# Patient Record
Sex: Female | Born: 1935 | Race: Black or African American | Hispanic: No | Smoking: Never smoker
Health system: Southern US, Community
[De-identification: ages and names within clinical notes are randomized; demographics above are authoritative.]

## PROBLEM LIST (undated history)

## (undated) DIAGNOSIS — I10 Essential (primary) hypertension: Secondary | ICD-10-CM

## (undated) DIAGNOSIS — Z90710 Acquired absence of both cervix and uterus: Secondary | ICD-10-CM

## (undated) DIAGNOSIS — C801 Malignant (primary) neoplasm, unspecified: Secondary | ICD-10-CM

## (undated) DIAGNOSIS — K219 Gastro-esophageal reflux disease without esophagitis: Secondary | ICD-10-CM

## (undated) DIAGNOSIS — I639 Cerebral infarction, unspecified: Secondary | ICD-10-CM

## (undated) DIAGNOSIS — M1990 Unspecified osteoarthritis, unspecified site: Secondary | ICD-10-CM

## (undated) DIAGNOSIS — E785 Hyperlipidemia, unspecified: Secondary | ICD-10-CM

## (undated) DIAGNOSIS — I2510 Atherosclerotic heart disease of native coronary artery without angina pectoris: Secondary | ICD-10-CM

## (undated) HISTORY — DX: Atherosclerotic heart disease of native coronary artery without angina pectoris: I25.10

## (undated) HISTORY — PX: CHOLECYSTECTOMY: SHX55

## (undated) HISTORY — PX: OTHER SURGICAL HISTORY: SHX169

## (undated) HISTORY — PX: ABDOMINAL HYSTERECTOMY: SHX81

## (undated) HISTORY — PX: TONSILLECTOMY: SUR1361

## (undated) HISTORY — PX: APPENDECTOMY: SHX54

## (undated) HISTORY — DX: Acquired absence of both cervix and uterus: Z90.710

## (undated) HISTORY — DX: Hyperlipidemia, unspecified: E78.5

---

## 1998-04-04 ENCOUNTER — Ambulatory Visit (HOSPITAL_COMMUNITY): Admission: RE | Admit: 1998-04-04 | Discharge: 1998-04-04 | Payer: Self-pay | Admitting: Specialist

## 1998-05-04 ENCOUNTER — Ambulatory Visit (HOSPITAL_COMMUNITY): Admission: RE | Admit: 1998-05-04 | Discharge: 1998-05-04 | Payer: Self-pay | Admitting: Specialist

## 2000-03-18 ENCOUNTER — Encounter: Admission: RE | Admit: 2000-03-18 | Discharge: 2000-06-16 | Payer: Self-pay | Admitting: Internal Medicine

## 2000-07-08 ENCOUNTER — Other Ambulatory Visit: Admission: RE | Admit: 2000-07-08 | Discharge: 2000-07-08 | Payer: Self-pay | Admitting: Internal Medicine

## 2010-10-17 ENCOUNTER — Inpatient Hospital Stay (HOSPITAL_COMMUNITY)
Admission: EM | Admit: 2010-10-17 | Discharge: 2010-10-21 | DRG: 313 | Disposition: A | Discharge status: Home / Self Care | Payer: Medicare Other | Source: Other Acute Inpatient Hospital | Attending: Internal Medicine | Admitting: Internal Medicine

## 2010-10-17 ENCOUNTER — Other Ambulatory Visit: Payer: Self-pay

## 2010-10-17 ENCOUNTER — Emergency Department (HOSPITAL_BASED_OUTPATIENT_CLINIC_OR_DEPARTMENT_OTHER)
Admission: EM | Admit: 2010-10-17 | Discharge: 2010-10-17 | Disposition: A | Discharge status: Other Acute Inpatient Hospital | Payer: Medicare Other | Source: Home / Self Care | Attending: Emergency Medicine | Admitting: Emergency Medicine

## 2010-10-17 ENCOUNTER — Emergency Department (INDEPENDENT_AMBULATORY_CARE_PROVIDER_SITE_OTHER): Payer: Medicare Other

## 2010-10-17 DIAGNOSIS — Z8673 Personal history of transient ischemic attack (TIA), and cerebral infarction without residual deficits: Secondary | ICD-10-CM

## 2010-10-17 DIAGNOSIS — Z8739 Personal history of other diseases of the musculoskeletal system and connective tissue: Secondary | ICD-10-CM | POA: Insufficient documentation

## 2010-10-17 DIAGNOSIS — Z9861 Coronary angioplasty status: Secondary | ICD-10-CM

## 2010-10-17 DIAGNOSIS — K219 Gastro-esophageal reflux disease without esophagitis: Secondary | ICD-10-CM | POA: Diagnosis present

## 2010-10-17 DIAGNOSIS — M1990 Unspecified osteoarthritis, unspecified site: Secondary | ICD-10-CM | POA: Diagnosis present

## 2010-10-17 DIAGNOSIS — R0602 Shortness of breath: Secondary | ICD-10-CM | POA: Insufficient documentation

## 2010-10-17 DIAGNOSIS — E785 Hyperlipidemia, unspecified: Secondary | ICD-10-CM | POA: Diagnosis present

## 2010-10-17 DIAGNOSIS — I10 Essential (primary) hypertension: Secondary | ICD-10-CM

## 2010-10-17 DIAGNOSIS — E876 Hypokalemia: Secondary | ICD-10-CM | POA: Diagnosis present

## 2010-10-17 DIAGNOSIS — Z8679 Personal history of other diseases of the circulatory system: Secondary | ICD-10-CM | POA: Insufficient documentation

## 2010-10-17 DIAGNOSIS — R079 Chest pain, unspecified: Secondary | ICD-10-CM

## 2010-10-17 DIAGNOSIS — Z8711 Personal history of peptic ulcer disease: Secondary | ICD-10-CM

## 2010-10-17 DIAGNOSIS — M7989 Other specified soft tissue disorders: Secondary | ICD-10-CM

## 2010-10-17 DIAGNOSIS — I2510 Atherosclerotic heart disease of native coronary artery without angina pectoris: Secondary | ICD-10-CM | POA: Diagnosis present

## 2010-10-17 DIAGNOSIS — Z01812 Encounter for preprocedural laboratory examination: Secondary | ICD-10-CM

## 2010-10-17 DIAGNOSIS — R0789 Other chest pain: Principal | ICD-10-CM | POA: Diagnosis present

## 2010-10-17 DIAGNOSIS — Z7902 Long term (current) use of antithrombotics/antiplatelets: Secondary | ICD-10-CM

## 2010-10-17 HISTORY — DX: Gastro-esophageal reflux disease without esophagitis: K21.9

## 2010-10-17 HISTORY — DX: Cerebral infarction, unspecified: I63.9

## 2010-10-17 HISTORY — DX: Malignant (primary) neoplasm, unspecified: C80.1

## 2010-10-17 HISTORY — DX: Essential (primary) hypertension: I10

## 2010-10-17 HISTORY — DX: Unspecified osteoarthritis, unspecified site: M19.90

## 2010-10-17 LAB — CBC
HCT: 40.6 % (ref 36.0–46.0)
MCH: 30.9 pg (ref 26.0–34.0)
MCV: 90.8 fL (ref 78.0–100.0)
RDW: 12.4 % (ref 11.5–15.5)
WBC: 7.7 10*3/uL (ref 4.0–10.5)

## 2010-10-17 LAB — COMPREHENSIVE METABOLIC PANEL
AST: 31 U/L (ref 0–37)
Albumin: 4.1 g/dL (ref 3.5–5.2)
Alkaline Phosphatase: 86 U/L (ref 39–117)
BUN: 19 mg/dL (ref 6–23)
Potassium: 4 mEq/L (ref 3.5–5.1)
Sodium: 141 mEq/L (ref 135–145)
Total Protein: 8.1 g/dL (ref 6.0–8.3)

## 2010-10-17 LAB — CARDIAC PANEL(CRET KIN+CKTOT+MB+TROPI)
CK, MB: 4.4 ng/mL — ABNORMAL HIGH (ref 0.3–4.0)
Relative Index: 1.8 (ref 0.0–2.5)
Troponin I: <0.3 ng/mL (ref <0.30)

## 2010-10-17 MED ORDER — SODIUM CHLORIDE 0.9 % IV SOLN: 20.0000 mL | INTRAVENOUS | Status: DC

## 2010-10-17 MED ORDER — NITROGLYCERIN 0.4 MG SL SUBL
0.4000 mg | SUBLINGUAL_TABLET | SUBLINGUAL | Status: DC | PRN
  Administered 2010-10-17: 0.4 mg via SUBLINGUAL
  Filled 2010-10-17: qty 25

## 2010-10-17 NOTE — ED Notes (Signed)
Patient on bedpan without difficulty and minimal assistance

## 2010-10-17 NOTE — ED Provider Notes (Signed)
History     Chief Complaint  Patient presents with  . Chest Pain   Patient is a 77 y.o. female presenting with chest pain. The history is provided by the patient.  Chest Pain The chest pain began 3 - 5 hours ago. Chest pain occurs intermittently. The pain is associated with breathing. At its most intense, the pain is at 4/10. The pain is currently at 1/10. The severity of the pain is moderate. The quality of the pain is described as aching. The pain radiates to the epigastrium and left shoulder. Chest pain is worsened by deep breathing. Primary symptoms include shortness of breath.  Pertinent negatives for associated symptoms include no near-syncope and no weakness. She tried nothing for the symptoms. Risk factors include being elderly.  Her past medical history is significant for CAD, hyperlipidemia and hypertension.  Her family medical history is significant for heart disease in family.  Procedure history is positive for cardiac catheterization.     Past Medical History  Diagnosis Date  . Hypertension   . GERD (gastroesophageal reflux disease)   . Stroke   . Arthritis   . Cancer     Past Surgical History  Procedure Date  . Multiple surgeries r/t cancer in legs   . Abdominal hysterectomy     No family history on file.  History  Substance Use Topics  . Smoking status: Never Smoker   . Smokeless tobacco: Not on file  . Alcohol Use: No    OB History    Grav Para Term Preterm Abortions TAB SAB Ect Mult Living                  Review of Systems  Respiratory: Positive for shortness of breath.   Cardiovascular: Positive for chest pain. Negative for near-syncope.  Neurological: Negative for weakness.  All other systems reviewed and are negative.    Physical Exam  BP 195/87  Pulse 61  Temp(Src) 98.2 F (36.8 C) (Oral)  Resp 20  Ht 5\' 4"  (1.626 m)  Wt 165 lb (74.844 kg)  BMI 28.32 kg/m2  SpO2 99%  Physical Exam  Constitutional: She appears well-developed and  well-nourished.  HENT:  Head: Normocephalic and atraumatic.  Eyes: Conjunctivae and EOM are normal. Pupils are equal, round, and reactive to light.  Neck: Normal range of motion. Neck supple.  Cardiovascular: Normal rate and regular rhythm.   Pulmonary/Chest: Effort normal and breath sounds normal.  Abdominal: Soft. Bowel sounds are normal.  Musculoskeletal: Normal range of motion.  Neurological: She has normal reflexes.  Skin: Skin is warm and dry.  Psychiatric: She has a normal mood and affect.    ED Course  Procedures  MDM   Pt reports no further chest pain after nitro, Pt reports she still feels bad but she is not having chest pain.   Date: 10/17/2010  Rate: 65  Rhythm: normal sinus rhythm  QRS Axis: normal  Intervals: normal  ST/T Wave abnormalities: normal  Conduction Disutrbances:none  Narrative Interpretation:   Old EKG Reviewed: none available

## 2010-10-17 NOTE — ED Notes (Signed)
Report given to [facility] and transport to [facility] , pt denies CP

## 2010-10-17 NOTE — ED Notes (Signed)
Report called to [facility]

## 2010-10-18 ENCOUNTER — Inpatient Hospital Stay (HOSPITAL_COMMUNITY): Payer: Medicare Other

## 2010-10-18 DIAGNOSIS — R072 Precordial pain: Secondary | ICD-10-CM

## 2010-10-18 LAB — CARDIAC PANEL(CRET KIN+CKTOT+MB+TROPI)
CK, MB: 3.3 ng/mL (ref 0.3–4.0)
Relative Index: 1.9 (ref 0.0–2.5)
Relative Index: 1.8 (ref 0.0–2.5)
Total CK: 183 U/L — ABNORMAL HIGH (ref 7–177)

## 2010-10-18 LAB — CBC
HCT: 38.1 % (ref 36.0–46.0)
MCHC: 33.6 g/dL (ref 30.0–36.0)
MCV: 91.1 fL (ref 78.0–100.0)
RDW: 12.5 % (ref 11.5–15.5)

## 2010-10-18 LAB — LIPID PANEL
HDL: 41 mg/dL (ref >39)
LDL Cholesterol: 97 mg/dL (ref 0–99)
Triglycerides: 139 mg/dL (ref <150)
VLDL: 28 mg/dL (ref 0–40)

## 2010-10-18 LAB — DIFFERENTIAL
Eosinophils Relative: 3 % (ref 0–5)
Lymphocytes Relative: 47 % — ABNORMAL HIGH (ref 12–46)
Lymphs Abs: 3.3 10*3/uL (ref 0.7–4.0)
Monocytes Absolute: 0.5 10*3/uL (ref 0.1–1.0)

## 2010-10-18 LAB — BASIC METABOLIC PANEL
BUN: 16 mg/dL (ref 6–23)
CO2: 28 mEq/L (ref 19–32)
Chloride: 104 mEq/L (ref 96–112)
Glucose, Bld: 97 mg/dL (ref 70–99)
Potassium: 3.6 mEq/L (ref 3.5–5.1)

## 2010-10-19 ENCOUNTER — Encounter (HOSPITAL_COMMUNITY): Payer: Medicare Other

## 2010-10-19 LAB — BASIC METABOLIC PANEL
BUN: 20 mg/dL (ref 6–23)
Chloride: 101 mEq/L (ref 96–112)
GFR calc Af Amer: >60 mL/min (ref >60)
Potassium: 3.3 mEq/L — ABNORMAL LOW (ref 3.5–5.1)
Sodium: 139 mEq/L (ref 135–145)

## 2010-10-19 LAB — CBC
HCT: 38.5 % (ref 36.0–46.0)
Platelets: 205 10*3/uL (ref 150–400)
RDW: 12.5 % (ref 11.5–15.5)
WBC: 7 10*3/uL (ref 4.0–10.5)

## 2010-10-19 MED ORDER — TECHNETIUM TC 99M TETROFOSMIN IV KIT
10.0000 | PACK | Freq: Once | INTRAVENOUS | Status: AC | PRN
  Administered 2010-10-19: 10 via INTRAVENOUS

## 2010-10-19 MED ORDER — TECHNETIUM TC 99M TETROFOSMIN IV KIT: 10.0000 | PACK | Freq: Once | INTRAVENOUS | Status: AC | PRN

## 2010-10-19 MED ORDER — TECHNETIUM TC 99M TETROFOSMIN IV KIT
30.0000 | PACK | Freq: Once | INTRAVENOUS | Status: AC | PRN
  Administered 2010-10-19: 30 via INTRAVENOUS

## 2010-10-20 ENCOUNTER — Other Ambulatory Visit (HOSPITAL_COMMUNITY): Payer: Medicare Other

## 2010-10-20 LAB — BASIC METABOLIC PANEL
CO2: 28 mEq/L (ref 19–32)
Chloride: 102 mEq/L (ref 96–112)
GFR calc Af Amer: 58 mL/min — ABNORMAL LOW (ref >60)
Potassium: 3.7 mEq/L (ref 3.5–5.1)
Sodium: 139 mEq/L (ref 135–145)

## 2010-10-20 NOTE — ED Provider Notes (Signed)
History/physical exam/procedure(s) were performed by non-physician practitioner and as supervising physician I was immediately available for consultation/collaboration. I have reviewed all notes and am in agreement with care and plan.   Hilario Quarry, MD 10/20/10 [phone]

## 2010-10-21 ENCOUNTER — Inpatient Hospital Stay (HOSPITAL_COMMUNITY): Payer: Medicare Other

## 2010-10-21 LAB — CBC
HCT: 38.3 % (ref 36.0–46.0)
Hemoglobin: 12.9 g/dL (ref 12.0–15.0)
MCV: 90.3 fL (ref 78.0–100.0)
RBC: 4.24 MIL/uL (ref 3.87–5.11)
WBC: 6.9 10*3/uL (ref 4.0–10.5)

## 2010-10-21 MED ORDER — XENON XE 133 GAS
10.0000 | GAS_FOR_INHALATION | Freq: Once | RESPIRATORY_TRACT | Status: AC | PRN
  Administered 2010-10-21: 10 via RESPIRATORY_TRACT

## 2010-10-21 MED ORDER — TECHNETIUM TO 99M ALBUMIN AGGREGATED
5.0000 | Freq: Once | INTRAVENOUS | Status: AC | PRN
  Administered 2010-10-21: 5 via INTRAVENOUS

## 2010-10-22 NOTE — Discharge Summary (Signed)
[name], Wendy Lucas              ACCOUNT NO.:  [id number]  MEDICAL RECORD NO.:  [id number]  LOCATION:  2036                         FACILITY:  [facility]  PHYSICIAN:  Isidor Holts, M.D.  DATE OF BIRTH:  1923-04-23  DATE OF ADMISSION:  10/17/2010 DATE OF DISCHARGE:  10/21/2010                              DISCHARGE SUMMARY   PRIMARY MD:  Dr. Hessie Dibble at [facility].  DISCHARGE DIAGNOSES: 1. Chest pain, likely noncardiac. Status post negative stress Myoview     on October 19, 2010. 2. History of coronary artery disease. 3. Hypertension. 4. Status post previous cerebrovascular accident. 5. Dyslipidemia. 6. Gastroesophageal reflux disease. 7. Osteoarthritis.  DISCHARGE MEDICATIONS: 1. Amlodipine 10 mg p.o. daily. 2. Plavix 75 mg p.o. daily with meals. 3. Atenolol 50 mg p.o. daily. 4. Benicar HCT (40/25) 1 tablet p.o. daily. 5. Clobetasol 0.05% ointment one application topically t.i.d. 6. Livalo 4 mg p.o. daily. 7. Protonix 40 mg p.o. daily. 8. Tylenol Extra Strength 1000 mg p.o. at bedtime. 9. Multivitamin therapeutic 1 p.o. daily. 10.Potassium chloride 10 mEq p.o. b.i.d.  PROCEDURES: 1. Chest x-ray on October 17, 2010.  This showed no evidence for acute     cardiopulmonary abnormality. 2. Stress Myoview, October 19, 2010.  This showed no evidence of     ischemia or infarction in any vascular territory, ejection fraction     was 77% and wall motion was normal. 3. Ventilation-perfusion lung scan, October 21, 2010.  This was very low     probability for acute pulmonary embolism.  There was mild     obstructive lung disease. 4. Chest x-ray, October 21, 2010.  This was stable examination with no     active cardiopulmonary process.  CONSULTATIONS:  Madolyn Frieze. Jens Som, MD, [facility], cardiologist.  ADMISSION HISTORY:  As in H&P notes of October 17, 2010, dictated by Dr. Della Goo.  However, in brief, this is an 77 year old female, with known history of hypertension, dyslipidemia, GERD,  previous CVA, osteoarthritis, history of coronary artery disease status post PCI approximately 11 years ago at [facility], presenting with two episodes of retrosternal chest pain.  She was admitted for further evaluation, investigation, and management.  CLINICAL COURSE: 1. Chest pain.  The patient presented as described above.  She was     placed on telemetric monitoring.  Cardiac enzymes were cycled and     remained unelevated.  Chest x-ray was unremarkable for acute     pathology.  Twelve-lead EKG showed no acute ischemic changes.     Cardiology consultation was kindly provided by Dr. Olga Millers,     who arranged a stress Myoview, which was carried out on October 19, 2010 and showed no evidence of ischemia or infarction in any     vascular territory.  The patient's ejection fraction was preserved     and estimated at 77%.  Wall motion was normal.  The patient has     been reassured accordingly.  Given her previous history of     cerebrovascular disease as well as a known coronary artery disease,     it was felt that she is definitely a candidate for antiplatelet  medications.  As she is intolerant of ASPIRIN, she has been     commenced on Plavix accordingly.  She continues on preadmission     beta-blocker.  2. Hypertension.  The patient's BP remained mildly elevated in the     initial couple of days of hospitalization, in spite of preadmission     antihypertensive medications.  Norvasc has therefore been added to     antihypertensive medications and as of October 20, 2010, she was     normotensive and remained so thereafter.  3. History of previous CVA.  The patient complains of occasional right-     sided tingling paresthesia without overt weakness, which appears to     be episodic.  This may represent TIAs.  She has been commenced on     antiplatelet medications as described above.  4. Dyslipidemia.  The patient's lipid profile was as follows:  Total     cholesterol 166,  triglyceride 139, HDL 41, LDL 97.  She continues     on her preadmission statin treatment.  5. GERD.  The patient was asymptomatic from this viewpoint.  She is on     proton pump inhibitor.  DISPOSITION:  The patient was on October 21, 2010 asymptomatic.  There were no new issues.  She was very keen to be discharged.  Because of an elevated D-dimer of 0.59, she underwent V/Q scan on October 21, 2010, which was reported as very low probability for pulmonary embolism.  The patient has been reassured accordingly.  She was discharged on October 21, 2010.  ACTIVITY:  As tolerated.  Recommended to increase activity slowly.  DIET:  Heart healthy.  FOLLOWUP INSTRUCTIONS:  The patient is to follow up with her primary MD, Dr. Hessie Dibble at [facility], per prior scheduled appointment, otherwise in 1-2 weeks.  She is instructed to call for an appointment.  In addition, she is to follow up with Dr. Olga Millers, [place] cardiologist at his [facility] office in 4-6 weeks.  The patient has been supplied with appropriate information and she and her family have verbalized understanding.     Isidor Holts, M.D.     CO/MEDQ  D:  10/21/2010  T:  10/21/2010  Job:  [id number]  cc:   Madolyn Frieze. Jens Som, MD, [facility] Dr. Hessie Dibble  Electronically Signed by Isidor Holts M.D. on 10/22/2010 07:17:25 PM

## 2010-10-23 NOTE — Consult Note (Signed)
Wendy Lucas, Wendy Lucas NO.:  [id number]  MEDICAL RECORD NO.:  [id number]  LOCATION:  2036                         FACILITY:  [facility]  PHYSICIAN:  Madolyn Frieze. Jens Som, MD, FACCDATE OF BIRTH:  02/10/1924  DATE OF CONSULTATION:  10/18/2010 DATE OF DISCHARGE:                                CONSULTATION   The patient is an 77 year old female with past medical history of coronary artery disease, hypertension, hyperlipidemia, prior CVA, peptic ulcer disease, and gastroesophageal reflux disease whom I am asked to evaluate chest pain.  The patient apparently had PCI approximately 11 years ago in [facility].  I do not have those records available.  She has had intermittent chest pain since that time.  However, yesterday, she had a more severe episode while shopping.  The pain is substernal and described as a pressing sensation.  It radiates to her neck and down her left upper extremity.  There is associated shortness of breath,diaphoresis, and nausea.  The pain is not pleuritic, positional, or exertional.  It resolved spontaneously in the past, but yesterday she went to [facility] and it improved with nitroglycerin. She has not had any pain since.  Note she denies any exertional chest pain, but does have dyspnea on exertion.  There is no orthopnea, but she occasionally has mild pedal edema.  Because of her chest pain, Cardiology is asked to further evaluate.  MEDICATIONS:  Her medications include: 1. Atenolol 50 mg daily. 2. Enoxaparin 40 mg subcu daily. 3. Hydrochlorothiazide 25 mg p.o. daily. 4. Nitroglycerin paste. 5. Benicar 40 mg p.o. daily. 6. Protonix 40 mg p.o. b.i.d. 7. P.r.n. medications.  ALLERGIES:  She has an allergy to DYE.  She also has an allergy to SULFA, CODEINE, ELAVIL, and ULTRAM.  FAMILY HISTORY:  Her family history is strongly positive for coronary artery disease.  PAST MEDICAL HISTORY:  Her past medical history is significant  for hypertension, hyperlipidemia.  There is no diabetes mellitus.  She does have a history of coronary artery disease as outlined in the HPI.  She has a history of CVAs.  She also has osteoarthritis and gastroesophageal reflux disease.  PAST SURGICAL HISTORY:  She has had prior surgery on her lower extremities for cancer of unknown type.  She has also had previous hysterectomy and tonsillectomy as well as appendectomy.  SOCIAL HISTORY:  She does not smoke nor does she consume alcohol.  REVIEW OF SYSTEMS:  She denies any headaches, fevers, or chills.  There is no productive cough or hemoptysis.  There is no dysphagia, odynophagia, melena, or hematochezia.  There is no dysuria or hematuria. There is no rash or seizure activity.  There is no orthopnea, PND, or pedal edema.  Remaining systems are negative.  PHYSICAL EXAMINATION:  VITAL SIGNS:  Blood pressure of 152/72 and pulse is 67.  Temperature is 97.4. GENERAL:  She is a well developed and well nourished, in no acute distress.  The patient does not appear to be depressed.  There is no peripheral clubbing. SKIN:  Warm and dry. BACK:  Normal. HEENT:  Normal with normal eyelids. NECK:  Supple with normal upstroke bilaterally.  No bruits noted.  There  is no jugular venous distention.  I cannot appreciate thyromegaly. CHEST:  Clear to auscultation and normal expansion. CARDIOVASCULAR:  Regular rate and rhythm.  Normal S1 and S2.  There was a 2/6 systolic ejection murmur at the sternal border. ABDOMEN:  Nontender and nondistended.  Positive bowel sounds.  No hepatosplenomegaly.  No mass appreciated.  There is no abdominal bruit. She has 2+ femoral pulses bilaterally.  No bruits. EXTREMITIES:  No edema and I can palpate no cords.  She has 2+ dorsalis pedis pulses bilaterally. NEUROLOGIC:  Grossly intact.  LABORATORY DATA:  Negative cardiac markers.  A pro-BNP is 336.  Her sodium is 141 with a potassium of 3.6.  BUN and creatinine 16  and 0.94. White blood cell count is 7.1 with a hemoglobin of 12.8 and hematocrit of 38.1.  Platelets 193.  Liver functions are normal.  Chest x-ray shows no acute cardiopulmonary abnormality.  Her electrocardiogram shows a sinus rhythm at a rate of 65.  There are no ST changes noted.  DIAGNOSES: 1. Chest pain - the patient's symptoms are concerning.  She has     multiple risk factors and prior PCI.  We discussed the options     today including cardiac catheterization versus functional study.  I     did recommend cardiac catheterization, but she is somewhat     hesitant.  She is concerned about her DYE allergy and also invasive     procedures at her age.  We therefore agreed that she will continue     with a functional study and if abnormal we would proceed with     cardiac catheterization at that point.  We will continue with her     present cardiac medications.  She states that she has had an     intolerance to ASPIRIN in the past secondary to GI upset.  I will     try enteric-coated aspirin 81 mg p.o. daily to see if she     tolerates.  We will also add a statin. 2. Hypertension - we will follow her blood pressure and increase her     medications as needed. 3. Hyperlipidemia - we will add a statin. 4. History of peptic ulcer disease.     Madolyn Frieze Jens Som, MD, [facility]     BSC/MEDQ  D:  10/18/2010  T:  10/18/2010  Job:  [id number]  Electronically Signed by Olga Millers MD [facility] on 10/23/2010 03:54:30 PM

## 2010-10-28 NOTE — H&P (Signed)
Wendy Lucas, Wendy Lucas NO.:  [id number]  MEDICAL RECORD NO.:  [id number]  LOCATION:  2036                         FACILITY:  [facility]  PHYSICIAN:  Della Goo, M.D. DATE OF BIRTH:  1924-02-01  DATE OF ADMISSION:  10/17/2010 DATE OF DISCHARGE:                             HISTORY & PHYSICAL   PRIMARY CARE PHYSICIAN:  Unassigned, Dr. Hessie Dibble with [facility].  CHIEF COMPLAINT:  Chest pain and shortness of breath.  HISTORY OF PRESENT ILLNESS:  This is an 77 year old female who presented to the [facility] Emergency Department secondary to complaints of chest pain which started at about noon on October 17, 2010. She reports having 2 episodes, the first episode lasted about a minute. She describes the pain is being intense in the substernal area.  She reports feeling some nausea and shortness of breath with that.  She rated the pain as being 4-6/10.  She reports that she had another episode of pain which returned and this time the pain began to radiate into the left shoulder.  She also had shortness of breath and nausea and dizziness and felt as if she would faint.  The patient does have a history of coronary artery disease and hyperlipidemia as well as hypertension and the family history of heart disease.  She reports over 11 years ago having a balloon angioplasty of one-vessel.  She denies having any stent placement in that vessel.  She reports that her cardiologist that performed the procedure is no longer in practice.  PAST MEDICAL HISTORY:  Significant for hypertension, hyperlipidemia, gastroesophageal reflux disease, previous CVA and osteoarthritis.  The patient has a history of cancer, reports that the cancer was in her legs.  MEDICATIONS: 1. Atenolol 50 mg 1 p.o. daily. 2. Benicar/HCT 40/25 one p.o. daily. 3. Protonix 40 mg 1 p.o. daily. 4. Acetaminophen 500 mg p.o. p.r.n. 5. Multivitamin 1 p.o. daily. 6. Pitavastatin calcium 4 mg do not know  when the interval is on this     medication. 7. Clobestasol ointment p.r.n.  ALLERGIES:  The patient has reported allergies to TRAMADOL, SULFA, SHELLFISH, ASPIRIN and CODEINE.  PAST SURGICAL HISTORY:  History of a total abdominal hysterectomy and appendectomy.  SOCIAL HISTORY:  The patient lives in a senior apartment independently. She is a nonsmoker and nondrinker.  No history of illicit drug usage.  FAMILY HISTORY:  Positive for coronary artery disease.  REVIEW OF SYSTEMS:  Pertinents mentioned above.  PHYSICAL EXAMINATION FINDINGS:  GENERAL:  This is an elderly well- nourished, well-developed 77 year old African American female, who is in no acute distress. VITAL SIGNS:  Temperature 98.2, blood pressure currently 124/78, heart rate 67, respirations 20 and O2 saturations 100%. HEENT:  Normocephalic, atraumatic.  Pupils equally round, reactive to light.  Extraocular movements are intact.  Funduscopic benign.  There is no scleral icterus. NECK:  Supple.  Full range of motion.  No thyromegaly, adenopathy or jugular venous distention. CARDIOVASCULAR:  Regular rate and rhythm.  No murmurs, gallops or rubs appreciated.  Normal S1 and S2.  Chest wall nontender.  Chest wall excursion is symmetric.  Breathing is unlabored. LUNGS:  Clear to auscultation bilaterally.  No rales, rhonchi or wheezes. ABDOMEN:  Positive bowel sounds, soft, nontender and nondistended.  No hepatosplenomegaly. EXTREMITIES:  Without cyanosis, clubbing or edema. NEUROLOGIC:  The patient is alert and oriented x3.  She is able to move all 4 of her extremities.  There are no focal deficits on examination.  LABORATORY STUDIES:  Performed at the [facility] Emergency Department, white blood cell count 7.7, hemoglobin 13.8, hematocrit 40.6 and platelets 223.  Sodium 141, potassium 4.0, chloride 103, CO2 27, BUN 19, creatinine 1.10 and glucose 84.  AST 31, ALT 20, alkaline phosphatase 86, total bilirubin  0.4.  Cardiac enzymes with a total CK of 249, CK-MB 4.4, relative index 1.8, troponin less than 0.30.  EKG reveals a normal sinus rhythm.  No acute ST-segment changes are seen and chest x-ray reveals no acute disease process.  ASSESSMENT:  An 77 year old female being admitted with: 1. Chest pain, rule out acute coronary syndrome. 2. Hypertension. 3. Coronary artery disease history. 4. Hyperlipidemia. 5. Osteoarthritis. 6. Shortness of breath.  PLAN:  The patient will be admitted to 23-hour observation to a telemetry area.  Cardiac enzymes will be performed and the patient will be placed on nitro paste therapy and oxygen therapy at this time. Aspirin therapy has not been ordered secondary to a reported allergy by this patient.  Her regular medications have been reconciled, but there are a few medications that will need to be further verified.  Code status is a full code at this time.  DVT prophylaxis has been ordered. Further cardiac workup will ensue pending results of the patient's clinical course.     Della Goo, M.D.     HJ/MEDQ  D:  10/18/2010  T:  10/18/2010  Job:  [id number]  Electronically Signed by Della Goo M.D. on 10/28/2010 09:24:56 PM

## 2010-11-02 ENCOUNTER — Ambulatory Visit (HOSPITAL_COMMUNITY): Admission: RE | Admit: 2010-11-02 | Payer: Medicare Other | Source: Ambulatory Visit

## 2010-11-29 ENCOUNTER — Encounter: Payer: Medicare Other | Admitting: Cardiology

## 2010-12-12 ENCOUNTER — Encounter: Payer: Self-pay | Admitting: Cardiology

## 2010-12-13 ENCOUNTER — Ambulatory Visit (INDEPENDENT_AMBULATORY_CARE_PROVIDER_SITE_OTHER): Payer: Medicare Other | Admitting: Cardiology

## 2010-12-13 ENCOUNTER — Encounter: Payer: Self-pay | Admitting: Cardiology

## 2010-12-13 DIAGNOSIS — R079 Chest pain, unspecified: Secondary | ICD-10-CM | POA: Insufficient documentation

## 2010-12-13 DIAGNOSIS — I2510 Atherosclerotic heart disease of native coronary artery without angina pectoris: Secondary | ICD-10-CM

## 2010-12-13 DIAGNOSIS — E785 Hyperlipidemia, unspecified: Secondary | ICD-10-CM | POA: Insufficient documentation

## 2010-12-13 DIAGNOSIS — I10 Essential (primary) hypertension: Secondary | ICD-10-CM | POA: Insufficient documentation

## 2010-12-13 NOTE — Progress Notes (Signed)
HPI: 77 year old female with prior coronary disease (PCI in [place] with no records available) for followup of chest pain. Admitted in August of 2012 with chest pain. Enzymes negative. Catheterization was recommended but patient preferred noninvasive approach if possible. Myoview in August of 2012 showed an ejection fraction of 77% and normal perfusion. VQ Scan low probability. Since discharge, she is some dyspnea on exertion and pedal edema towards the end of the day. She also has some chest tightness with exertion. She also describes chest pain 2 weeks ago in the left chest area leading to her left upper extremity. She states she has had both of these kinds of pain for years and they are unchanged.  Current Outpatient Prescriptions  Medication Sig Dispense Refill  . acetaminophen (TYLENOL) 500 MG tablet Take 1,000 mg by mouth at bedtime.        Marland Kitchen amLODipine (NORVASC) 10 MG tablet Take 10 mg by mouth daily.        Marland Kitchen atenolol (TENORMIN) 50 MG tablet Take 50 mg by mouth daily.        . clobetasol (TEMOVATE) 0.05 % ointment Apply topically 2 (two) times daily.        . clopidogrel (PLAVIX) 75 MG tablet Take 75 mg by mouth daily.        . Multiple Vitamin (MULTIVITAMIN) tablet Take 1 tablet by mouth daily.        Marland Kitchen olmesartan-hydrochlorothiazide (BENICAR HCT) 40-25 MG per tablet Take 1 tablet by mouth daily.        . pantoprazole (PROTONIX) 40 MG tablet Take 40 mg by mouth daily.        . Pitavastatin Calcium (LIVALO) 4 MG TABS Take 1 tablet by mouth daily.        . potassium chloride (KLOR-CON) 10 MEQ CR tablet Take 10 mEq by mouth 2 (two) times daily.           Past Medical History  Diagnosis Date  . Hypertension   . GERD (gastroesophageal reflux disease)   . Stroke   . Arthritis   . Cancer   . Coronary artery disease   . Hyperlipidemia   . H/O: hysterectomy     Past Surgical History  Procedure Date  . Multiple surgeries r/t cancer in legs   . Abdominal hysterectomy   .  Tonsillectomy     History   Social History  . Marital Status: Married    Spouse Name: N/A    Number of Children: N/A  . Years of Education: N/A   Occupational History  . Not on file.   Social History Main Topics  . Smoking status: Never Smoker   . Smokeless tobacco: Not on file  . Alcohol Use: No  . Drug Use:   . Sexually Active:    Other Topics Concern  . Not on file   Social History Narrative  . No narrative on file    ROS: no fevers or chills, productive cough, hemoptysis, dysphasia, odynophagia, melena, hematochezia, dysuria, hematuria, rash, seizure activity, orthopnea, PND, pedal edema, claudication. Remaining systems are negative.  Physical Exam: Well-developed well-nourished in no acute distress.  Skin is warm and dry.  HEENT is normal.  Neck is supple. No thyromegaly.  Chest is clear to auscultation with normal expansion.  Cardiovascular exam is regular rate and rhythm.  Abdominal exam nontender or distended. No masses palpated. Extremities show trace edema. neuro grossly intact  ECG Sinus brady, no ST changes

## 2010-12-13 NOTE — Assessment & Plan Note (Signed)
Patient continues with intermittent chest pain However she states she has had these for years. Recent Myoview negative. I offered to pursue cardiac catheterization but she declined instead preferring conservative measures. She states "I am too old" to have tests.

## 2010-12-13 NOTE — Assessment & Plan Note (Signed)
The patient has an allergy to aspirin. Continue Plavix. Continue beta blocker and statin. Reason Myoview negative.

## 2010-12-13 NOTE — Assessment & Plan Note (Signed)
Blood pressure mildly elevated but she states normal at home. Continue present medications. She is describing some dyspnea. Check BNP. If elevated will increase diuretics.

## 2010-12-13 NOTE — Patient Instructions (Signed)
Your physician wants you to follow-up in: 6 MONTHS You will receive a reminder letter in the mail two months in advance. If you don't receive a letter, please call our office to schedule the follow-up appointment.   Your physician recommends that you return for lab work in: TODAY  

## 2010-12-13 NOTE — Assessment & Plan Note (Signed)
Continue statin. Lipids and liver per primary care.

## 2011-01-05 ENCOUNTER — Telehealth: Payer: Self-pay | Admitting: Cardiology

## 2011-01-05 NOTE — Telephone Encounter (Signed)
[facility] lab calling for diagnosis code--code given--nt

## 2011-01-05 NOTE — Telephone Encounter (Signed)
Lab needs dx code for lab work on 9-26/bmp/  Please call Tonya at ext [phone]

## 2011-11-20 ENCOUNTER — Emergency Department (HOSPITAL_BASED_OUTPATIENT_CLINIC_OR_DEPARTMENT_OTHER)
Admission: EM | Admit: 2011-11-20 | Discharge: 2011-11-20 | Disposition: A | Discharge status: Home / Self Care | Payer: Medicare Other | Attending: Emergency Medicine | Admitting: Emergency Medicine

## 2011-11-20 ENCOUNTER — Encounter (HOSPITAL_BASED_OUTPATIENT_CLINIC_OR_DEPARTMENT_OTHER): Payer: Self-pay | Admitting: *Deleted

## 2011-11-20 DIAGNOSIS — Z882 Allergy status to sulfonamides status: Secondary | ICD-10-CM | POA: Insufficient documentation

## 2011-11-20 DIAGNOSIS — Z8673 Personal history of transient ischemic attack (TIA), and cerebral infarction without residual deficits: Secondary | ICD-10-CM | POA: Insufficient documentation

## 2011-11-20 DIAGNOSIS — Z79899 Other long term (current) drug therapy: Secondary | ICD-10-CM | POA: Insufficient documentation

## 2011-11-20 DIAGNOSIS — Z91013 Allergy to seafood: Secondary | ICD-10-CM | POA: Insufficient documentation

## 2011-11-20 DIAGNOSIS — I10 Essential (primary) hypertension: Secondary | ICD-10-CM | POA: Insufficient documentation

## 2011-11-20 DIAGNOSIS — K219 Gastro-esophageal reflux disease without esophagitis: Secondary | ICD-10-CM | POA: Insufficient documentation

## 2011-11-20 DIAGNOSIS — E785 Hyperlipidemia, unspecified: Secondary | ICD-10-CM | POA: Insufficient documentation

## 2011-11-20 DIAGNOSIS — N390 Urinary tract infection, site not specified: Secondary | ICD-10-CM | POA: Insufficient documentation

## 2011-11-20 DIAGNOSIS — M129 Arthropathy, unspecified: Secondary | ICD-10-CM | POA: Insufficient documentation

## 2011-11-20 DIAGNOSIS — Z886 Allergy status to analgesic agent status: Secondary | ICD-10-CM | POA: Insufficient documentation

## 2011-11-20 DIAGNOSIS — I2510 Atherosclerotic heart disease of native coronary artery without angina pectoris: Secondary | ICD-10-CM | POA: Insufficient documentation

## 2011-11-20 LAB — URINALYSIS, ROUTINE W REFLEX MICROSCOPIC
Glucose, UA: NEGATIVE mg/dL
Specific Gravity, Urine: 1.018 (ref 1.005–1.030)

## 2011-11-20 LAB — URINE MICROSCOPIC-ADD ON

## 2011-11-20 MED ORDER — ONDANSETRON 8 MG PO TBDP
8.0000 mg | ORAL_TABLET | Freq: Once | ORAL | Status: AC
  Administered 2011-11-20: 8 mg via ORAL
  Filled 2011-11-20: qty 1

## 2011-11-20 MED ORDER — CEPHALEXIN 250 MG PO CAPS
500.0000 mg | ORAL_CAPSULE | Freq: Once | ORAL | Status: AC
  Administered 2011-11-20: 500 mg via ORAL
  Filled 2011-11-20: qty 2

## 2011-11-20 MED ORDER — PHENAZOPYRIDINE HCL 200 MG PO TABS: 200.0000 mg | ORAL_TABLET | Freq: Three times a day (TID) | ORAL | Status: AC

## 2011-11-20 MED ORDER — CEPHALEXIN 500 MG PO CAPS: 500.0000 mg | ORAL_CAPSULE | Freq: Four times a day (QID) | ORAL | Status: AC

## 2011-11-20 MED ORDER — PHENAZOPYRIDINE HCL 100 MG PO TABS
200.0000 mg | ORAL_TABLET | Freq: Once | ORAL | Status: AC
  Administered 2011-11-20: 200 mg via ORAL
  Filled 2011-11-20: qty 2

## 2011-11-20 MED ORDER — ONDANSETRON HCL 4 MG PO TABS: 4.0000 mg | ORAL_TABLET | Freq: Four times a day (QID) | ORAL | Status: AC

## 2011-11-20 NOTE — ED Provider Notes (Signed)
Medical screening examination/treatment/procedure(s) were conducted as a shared visit with non-physician practitioner(s) and myself.  I personally evaluated the patient during the encounter   Cyndra Numbers, MD 11/20/11 1537

## 2011-11-20 NOTE — ED Notes (Signed)
Dysuria

## 2011-11-20 NOTE — ED Provider Notes (Signed)
History     CSN: [id number]  Arrival date & time 11/20/11  1323   First MD Initiated Contact with Patient 11/20/11 1334      Chief Complaint  Patient presents with  . Dysuria    (Consider location/radiation/quality/duration/timing/severity/associated sxs/prior treatment) HPI Comments: Patient presents today with the chief complaint of dysuria that began Sunday morning. She states her urine has a foul odor and cloudy color. She denies nausea, vomiting, hematuria, vaginal discharge and fever. She denies change in mental status or being confused. Her sister is here with her and states she is at her baseline. She has a past medical history of UTIs and states this feels similar to her previous UTIs. She has taken Tylenol which has provided moderate pain relief.   Patient is a 78 y.o. female presenting with dysuria. The history is provided by the patient.  Dysuria  This is a new problem. The current episode started 2 days ago. The problem occurs every urination. The problem has not changed since onset.The quality of the pain is described as burning. The pain is moderate. There has been no fever. Pertinent negatives include no chills, no sweats, no nausea, no vomiting, no discharge, no hematuria and no flank pain. She has tried acetaminophen for the symptoms. Her past medical history is significant for recurrent UTIs.    Past Medical History  Diagnosis Date  . Hypertension   . GERD (gastroesophageal reflux disease)   . Stroke   . Arthritis   . Cancer   . Coronary artery disease   . Hyperlipidemia   . H/O: hysterectomy     Past Surgical History  Procedure Date  . Multiple surgeries r/t cancer in legs   . Abdominal hysterectomy   . Tonsillectomy     History reviewed. No pertinent family history.  History  Substance Use Topics  . Smoking status: Never Smoker   . Smokeless tobacco: Not on file  . Alcohol Use: No    OB History    Grav Para Term Preterm Abortions TAB SAB Ect Mult  Living                  Review of Systems  Constitutional: Positive for appetite change. Negative for fever and chills.  Gastrointestinal: Negative for nausea, vomiting and abdominal pain.  Genitourinary: Positive for dysuria. Negative for hematuria, flank pain, vaginal discharge, difficulty urinating and pelvic pain.       Suprapubic pain  Neurological: Negative for headaches.  Psychiatric/Behavioral: Negative for confusion.    Allergies  Iohexol; Shellfish allergy; Aspirin; Codeine; Sulfa antibiotics; and Tramadol  Home Medications   Current Outpatient Rx  Name Route Sig Dispense Refill  . ACETAMINOPHEN 500 MG PO TABS Oral Take 1,000 mg by mouth at bedtime.      Marland Kitchen AMLODIPINE BESYLATE 10 MG PO TABS Oral Take 10 mg by mouth daily.      . ATENOLOL 50 MG PO TABS Oral Take 50 mg by mouth daily.      Marland Kitchen CLOBETASOL PROPIONATE 0.05 % EX OINT Topical Apply topically 2 (two) times daily.      Marland Kitchen CLOPIDOGREL BISULFATE 75 MG PO TABS Oral Take 75 mg by mouth daily.      Marland Kitchen ONE-DAILY MULTI VITAMINS PO TABS Oral Take 1 tablet by mouth daily.      Marland Kitchen OLMESARTAN MEDOXOMIL-HCTZ 40-25 MG PO TABS Oral Take 1 tablet by mouth daily.      Marland Kitchen PANTOPRAZOLE SODIUM 40 MG PO TBEC Oral Take 40 mg  by mouth daily.      Marland Kitchen PITAVASTATIN CALCIUM 4 MG PO TABS Oral Take 1 tablet by mouth daily.      Marland Kitchen POTASSIUM CHLORIDE 10 MEQ PO TBCR Oral Take 10 mEq by mouth 2 (two) times daily.        BP 173/67  Pulse 66  Temp 98.2 F (36.8 C)  Resp 20  SpO2 98%  Physical Exam  Nursing note and vitals reviewed. Constitutional: She is oriented to person, place, and time. She appears well-developed and well-nourished.       In no acute distress  Cardiovascular: Normal rate and regular rhythm.   Pulmonary/Chest: Effort normal and breath sounds normal.  Abdominal: Soft. Bowel sounds are normal. She exhibits no distension and no mass. There is no tenderness. There is no rebound and no guarding.  Genitourinary:       Suprapubic  tenderness to palpation  Neurological: She is alert and oriented to person, place, and time.    ED Course  Procedures (including critical care time)   Labs Reviewed  URINALYSIS, ROUTINE W REFLEX MICROSCOPIC  URINE CULTURE   No results found. Results for orders placed during the hospital encounter of 11/20/11  URINALYSIS, ROUTINE W REFLEX MICROSCOPIC      Component Value Range   Color, Urine YELLOW  YELLOW   APPearance CLOUDY (*) CLEAR   Specific Gravity, Urine 1.018  1.005 - 1.030   pH 6.5  5.0 - 8.0   Glucose, UA NEGATIVE  NEGATIVE mg/dL   Hgb urine dipstick MODERATE (*) NEGATIVE   Bilirubin Urine NEGATIVE  NEGATIVE   Ketones, ur NEGATIVE  NEGATIVE mg/dL   Protein, ur NEGATIVE  NEGATIVE mg/dL   Urobilinogen, UA 1.0  0.0 - 1.0 mg/dL   Nitrite NEGATIVE  NEGATIVE   Leukocytes, UA LARGE (*) NEGATIVE  URINE MICROSCOPIC-ADD ON      Component Value Range   Squamous Epithelial / LPF RARE  RARE   WBC, UA TOO NUMEROUS TO COUNT  <3 WBC/hpf   RBC / HPF 21-50  <3 RBC/hpf   Bacteria, UA MANY (*) RARE   BP 173/67  Pulse 66  Temp 98.2 F (36.8 C)  Resp 20  SpO2 98%  No diagnosis found. 1. Uncomplicated urinary tract infection   MDM  Patient's VSS, no fever. UA and symptoms consistent with uncomplicated UTI. Prescribed Keflex and pyridium.         Rodena Medin, PA-C 11/20/11 1521

## 2011-11-22 LAB — URINE CULTURE: Colony Count: >=100000

## 2011-11-23 NOTE — ED Notes (Signed)
+  Urine. Patient treated with Keflex. Sensitive to same. Per protocol MD. °

## 2013-04-19 ENCOUNTER — Encounter (HOSPITAL_BASED_OUTPATIENT_CLINIC_OR_DEPARTMENT_OTHER): Payer: Self-pay | Admitting: Emergency Medicine

## 2013-04-19 ENCOUNTER — Emergency Department (HOSPITAL_BASED_OUTPATIENT_CLINIC_OR_DEPARTMENT_OTHER)
Admission: EM | Admit: 2013-04-19 | Discharge: 2013-04-19 | Disposition: A | Discharge status: Home / Self Care | Payer: Medicare Other | Attending: Emergency Medicine | Admitting: Emergency Medicine

## 2013-04-19 DIAGNOSIS — Z859 Personal history of malignant neoplasm, unspecified: Secondary | ICD-10-CM | POA: Insufficient documentation

## 2013-04-19 DIAGNOSIS — Z79899 Other long term (current) drug therapy: Secondary | ICD-10-CM | POA: Insufficient documentation

## 2013-04-19 DIAGNOSIS — I10 Essential (primary) hypertension: Secondary | ICD-10-CM | POA: Insufficient documentation

## 2013-04-19 DIAGNOSIS — M129 Arthropathy, unspecified: Secondary | ICD-10-CM | POA: Insufficient documentation

## 2013-04-19 DIAGNOSIS — K219 Gastro-esophageal reflux disease without esophagitis: Secondary | ICD-10-CM | POA: Insufficient documentation

## 2013-04-19 DIAGNOSIS — I2510 Atherosclerotic heart disease of native coronary artery without angina pectoris: Secondary | ICD-10-CM | POA: Insufficient documentation

## 2013-04-19 DIAGNOSIS — E785 Hyperlipidemia, unspecified: Secondary | ICD-10-CM | POA: Insufficient documentation

## 2013-04-19 DIAGNOSIS — N390 Urinary tract infection, site not specified: Secondary | ICD-10-CM | POA: Insufficient documentation

## 2013-04-19 DIAGNOSIS — Z90710 Acquired absence of both cervix and uterus: Secondary | ICD-10-CM | POA: Insufficient documentation

## 2013-04-19 DIAGNOSIS — Z7902 Long term (current) use of antithrombotics/antiplatelets: Secondary | ICD-10-CM | POA: Insufficient documentation

## 2013-04-19 DIAGNOSIS — Z8673 Personal history of transient ischemic attack (TIA), and cerebral infarction without residual deficits: Secondary | ICD-10-CM | POA: Insufficient documentation

## 2013-04-19 LAB — CBC WITH DIFFERENTIAL/PLATELET
Basophils Absolute: 0 10*3/uL (ref 0.0–0.1)
Basophils Relative: 0 % (ref 0–1)
EOS ABS: 0.3 10*3/uL (ref 0.0–0.7)
EOS PCT: 4 % (ref 0–5)
HCT: 39.5 % (ref 36.0–46.0)
HEMOGLOBIN: 13 g/dL (ref 12.0–15.0)
LYMPHS ABS: 2.4 10*3/uL (ref 0.7–4.0)
Lymphocytes Relative: 40 % (ref 12–46)
MCH: 30.5 pg (ref 26.0–34.0)
MCHC: 32.9 g/dL (ref 30.0–36.0)
MCV: 92.7 fL (ref 78.0–100.0)
MONOS PCT: 9 % (ref 3–12)
Monocytes Absolute: 0.5 10*3/uL (ref 0.1–1.0)
Neutro Abs: 2.9 10*3/uL (ref 1.7–7.7)
Neutrophils Relative %: 47 % (ref 43–77)
Platelets: 186 10*3/uL (ref 150–400)
RBC: 4.26 MIL/uL (ref 3.87–5.11)
RDW: 12.7 % (ref 11.5–15.5)
WBC: 6.1 10*3/uL (ref 4.0–10.5)

## 2013-04-19 LAB — URINE MICROSCOPIC-ADD ON

## 2013-04-19 LAB — BASIC METABOLIC PANEL
BUN: 14 mg/dL (ref 6–23)
CALCIUM: 9.1 mg/dL (ref 8.4–10.5)
CO2: 23 mEq/L (ref 19–32)
Chloride: 100 mEq/L (ref 96–112)
Creatinine, Ser: 1.08 mg/dL (ref 0.50–1.10)
GFR calc Af Amer: 52 mL/min — ABNORMAL LOW (ref >90)
GFR, EST NON AFRICAN AMERICAN: 45 mL/min — AB (ref >90)
GLUCOSE: 100 mg/dL — AB (ref 70–99)
Potassium: 3.6 mEq/L — ABNORMAL LOW (ref 3.7–5.3)
Sodium: 141 mEq/L (ref 137–147)

## 2013-04-19 LAB — URINALYSIS, ROUTINE W REFLEX MICROSCOPIC
BILIRUBIN URINE: NEGATIVE
Glucose, UA: NEGATIVE mg/dL
Ketones, ur: NEGATIVE mg/dL
Nitrite: NEGATIVE
PH: 7 (ref 5.0–8.0)
Protein, ur: NEGATIVE mg/dL
SPECIFIC GRAVITY, URINE: 1.013 (ref 1.005–1.030)
UROBILINOGEN UA: 0.2 mg/dL (ref 0.0–1.0)

## 2013-04-19 LAB — POCT I-STAT, CHEM 8
BUN: 14 mg/dL (ref 6–23)
CALCIUM ION: 1.21 mmol/L (ref 1.13–1.30)
Chloride: 101 mEq/L (ref 96–112)
Creatinine, Ser: 1.2 mg/dL — ABNORMAL HIGH (ref 0.50–1.10)
Glucose, Bld: 105 mg/dL — ABNORMAL HIGH (ref 70–99)
HEMATOCRIT: 43 % (ref 36.0–46.0)
Hemoglobin: 14.6 g/dL (ref 12.0–15.0)
Potassium: 3.4 mEq/L — ABNORMAL LOW (ref 3.7–5.3)
Sodium: 142 mEq/L (ref 137–147)
TCO2: 27 mmol/L (ref 0–100)

## 2013-04-19 MED ORDER — SODIUM CHLORIDE 0.9 % IV BOLUS (SEPSIS)
500.0000 mL | Freq: Once | INTRAVENOUS | Status: AC
  Administered 2013-04-19: 500 mL via INTRAVENOUS

## 2013-04-19 MED ORDER — CEPHALEXIN 500 MG PO CAPS: 500.0000 mg | ORAL_CAPSULE | Freq: Three times a day (TID) | ORAL | Status: DC

## 2013-04-19 MED ORDER — CEFTRIAXONE SODIUM 1 G IJ SOLR
INTRAMUSCULAR | Status: AC
  Filled 2013-04-19: qty 10

## 2013-04-19 MED ORDER — DEXTROSE 5 % IV SOLN
1.0000 g | Freq: Once | INTRAVENOUS | Status: AC
  Administered 2013-04-19: 1 g via INTRAVENOUS

## 2013-04-19 NOTE — ED Notes (Signed)
MD at bedside. 

## 2013-04-19 NOTE — ED Provider Notes (Signed)
CSN: [id number]     Arrival date & time 04/19/13  7124 History   First MD Initiated Contact with Patient 04/19/13 [phone]     No chief complaint on file.  (Consider location/radiation/quality/duration/timing/severity/associated sxs/prior Treatment) HPI 8:52 AM- patient not roomed yet 8:53 AM no vitals on chart  9:06 AM patient in room 80 y.o. Female complaining of 5 days history of urinary frequency and dysuria consistent with previous utis.  She has not had fever, chills, weakness, nausea or vomiting.  She has not recently been treated for uti.  She denies abdominal pain.  She has been eating her usual meals.  She did not take her blood pressure medicine today because she was coming to ed.   Past Medical History  Diagnosis Date  . Hypertension   . GERD (gastroesophageal reflux disease)   . Stroke   . Arthritis   . Cancer   . Coronary artery disease   . Hyperlipidemia   . H/O: hysterectomy    Past Surgical History  Procedure Laterality Date  . Multiple surgeries r/t cancer in legs    . Abdominal hysterectomy    . Tonsillectomy     No family history on file. History  Substance Use Topics  . Smoking status: Never Smoker   . Smokeless tobacco: Not on file  . Alcohol Use: No   OB History   Grav Para Term Preterm Abortions TAB SAB Ect Mult Living                 Review of Systems  Constitutional: Negative for fever, chills and unexpected weight change.  HENT: Negative for congestion.   Eyes: Negative for discharge.  Respiratory: Negative for shortness of breath.   Cardiovascular: Negative for chest pain.  Gastrointestinal: Negative for nausea, vomiting, abdominal pain, diarrhea, constipation, blood in stool, abdominal distention, anal bleeding and rectal pain.  Genitourinary: Positive for dysuria. Negative for difficulty urinating.  Allergic/Immunologic: Negative.   Neurological: Negative.   All other systems reviewed and are negative.    Allergies  Iohexol; Shellfish  allergy; Aspirin; Codeine; Sulfa antibiotics; and Tramadol  Home Medications   Current Outpatient Rx  Name  Route  Sig  Dispense  Refill  . acetaminophen (TYLENOL) 500 MG tablet   Oral   Take 1,000 mg by mouth at bedtime.           Marland Kitchen amLODipine (NORVASC) 10 MG tablet   Oral   Take 10 mg by mouth daily.           Marland Kitchen atenolol (TENORMIN) 50 MG tablet   Oral   Take 50 mg by mouth daily.           . clobetasol (TEMOVATE) 0.05 % ointment   Topical   Apply topically 2 (two) times daily.           . clopidogrel (PLAVIX) 75 MG tablet   Oral   Take 75 mg by mouth daily.           . Multiple Vitamin (MULTIVITAMIN) tablet   Oral   Take 1 tablet by mouth daily.           Marland Kitchen olmesartan-hydrochlorothiazide (BENICAR HCT) 40-25 MG per tablet   Oral   Take 1 tablet by mouth daily.           . pantoprazole (PROTONIX) 40 MG tablet   Oral   Take 40 mg by mouth daily.           . Pitavastatin Calcium (  LIVALO) 4 MG TABS   Oral   Take 1 tablet by mouth daily.           . potassium chloride (KLOR-CON) 10 MEQ CR tablet   Oral   Take 10 mEq by mouth 2 (two) times daily.            There were no vitals taken for this visit. Physical Exam  Nursing note and vitals reviewed. Constitutional: She is oriented to person, place, and time. She appears well-developed and well-nourished.  HENT:  Head: Normocephalic and atraumatic.  Right Ear: External ear normal.  Left Ear: External ear normal.  Nose: Nose normal.  Mouth/Throat: Oropharynx is clear and moist.  Eyes: Conjunctivae and EOM are normal. Pupils are equal, round, and reactive to light.  Neck: Normal range of motion. Neck supple.  Cardiovascular: Normal rate, regular rhythm, normal heart sounds and intact distal pulses.   Pulmonary/Chest: Effort normal and breath sounds normal.  Abdominal: Soft. Bowel sounds are normal. She exhibits no distension and no mass. There is no guarding.  Mild suprapubic ttp,no rebound.    Musculoskeletal: Normal range of motion.  Neurological: She is alert and oriented to person, place, and time. She has normal reflexes.  Skin: Skin is warm and dry.  Psychiatric: She has a normal mood and affect. Her behavior is normal. Judgment and thought content normal.    ED Course  Procedures (including critical care time) Labs Review Labs Reviewed  URINALYSIS, ROUTINE W REFLEX MICROSCOPIC - Abnormal; Notable for the following:    Hgb urine dipstick SMALL (*)    Leukocytes, UA LARGE (*)    All other components within normal limits  BASIC METABOLIC PANEL - Abnormal; Notable for the following:    Potassium 3.6 (*)    Glucose, Bld 100 (*)    GFR calc non Af Amer 45 (*)    GFR calc Af Amer 52 (*)    All other components within normal limits  URINE MICROSCOPIC-ADD ON - Abnormal; Notable for the following:    Bacteria, UA MANY (*)    All other components within normal limits  POCT I-STAT, CHEM 8 - Abnormal; Notable for the following:    Potassium 3.4 (*)    Creatinine, Ser 1.20 (*)    Glucose, Bld 105 (*)    All other components within normal limits  URINE CULTURE  CBC WITH DIFFERENTIAL   Imaging Review No results found.  EKG Interpretation   None       MDM  Patient presents with urinary tract infection symptoms of dysuria and frequency today. Urinalysis is significant for 21-50 white blood cells, and many bacteria. She was given 1 g of Rocephin here. She's not appear to have fever, elevated white blood cell count, or CVA tenderness area she is taking by mouth here and appears to be stable for discharge. Her blood pressure was significantly elevated but she has not taken her multiple morning blood pressure medicines. She is advised to take her usual medicines and have this rechecked at her doctor's office tomorrow. She is given return precautions of worsening abdominal discomfort, vomiting, fever as well as any other concerning symptoms which should provoke her return to the  emergency department. She voices understanding and is discharged in improved condition    Shaune Pollack, MD 04/19/13 1108

## 2013-04-19 NOTE — Discharge Instructions (Signed)
Urinary Tract Infection  Urinary tract infections (UTIs) can develop anywhere along your urinary tract. Your urinary tract is your body's drainage system for removing wastes and extra water. Your urinary tract includes two kidneys, two ureters, a bladder, and a urethra. Your kidneys are a pair of bean-shaped organs. Each kidney is about the size of your fist. They are located below your ribs, one on each side of your spine.  CAUSES  Infections are caused by microbes, which are microscopic organisms, including fungi, viruses, and bacteria. These organisms are so small that they can only be seen through a microscope. Bacteria are the microbes that most commonly cause UTIs.  SYMPTOMS   Symptoms of UTIs may vary by age and gender of the patient and by the location of the infection. Symptoms in young women typically include a frequent and intense urge to urinate and a painful, burning feeling in the bladder or urethra during urination. Older women and men are more likely to be tired, shaky, and weak and have muscle aches and abdominal pain. A fever may mean the infection is in your kidneys. Other symptoms of a kidney infection include pain in your back or sides below the ribs, nausea, and vomiting.  DIAGNOSIS  To diagnose a UTI, your caregiver will ask you about your symptoms. Your caregiver also will ask to provide a urine sample. The urine sample will be tested for bacteria and white blood cells. White blood cells are made by your body to help fight infection.  TREATMENT   Typically, UTIs can be treated with medication. Because most UTIs are caused by a bacterial infection, they usually can be treated with the use of antibiotics. The choice of antibiotic and length of treatment depend on your symptoms and the type of bacteria causing your infection.  HOME CARE INSTRUCTIONS   If you were prescribed antibiotics, take them exactly as your caregiver instructs you. Finish the medication even if you feel better after you  have only taken some of the medication.   Drink enough water and fluids to keep your urine clear or pale yellow.   Avoid caffeine, tea, and carbonated beverages. They tend to irritate your bladder.   Empty your bladder often. Avoid holding urine for long periods of time.   Empty your bladder before and after sexual intercourse.   After a bowel movement, women should cleanse from front to back. Use each tissue only once.  SEEK MEDICAL CARE IF:    You have back pain.   You develop a fever.   Your symptoms do not begin to resolve within 3 days.  SEEK IMMEDIATE MEDICAL CARE IF:    You have severe back pain or lower abdominal pain.   You develop chills.   You have nausea or vomiting.   You have continued burning or discomfort with urination.  MAKE SURE YOU:    Understand these instructions.   Will watch your condition.   Will get help right away if you are not doing well or get worse.  Document Released: 12/13/2004 Document Revised: 09/04/2011 Document Reviewed: 04/13/2011  [employer] Patient Information 2014 [employer].

## 2013-04-19 NOTE — ED Notes (Signed)
Urinary frequency, urgency and discomfort x4 days. Started getting somewhat dizzy today.

## 2013-04-21 LAB — URINE CULTURE: Colony Count: >=100000

## 2013-10-03 ENCOUNTER — Encounter (HOSPITAL_BASED_OUTPATIENT_CLINIC_OR_DEPARTMENT_OTHER): Payer: Self-pay | Admitting: Emergency Medicine

## 2013-10-03 ENCOUNTER — Emergency Department (HOSPITAL_BASED_OUTPATIENT_CLINIC_OR_DEPARTMENT_OTHER): Payer: Medicare Other

## 2013-10-03 ENCOUNTER — Emergency Department (HOSPITAL_BASED_OUTPATIENT_CLINIC_OR_DEPARTMENT_OTHER)
Admission: EM | Admit: 2013-10-03 | Discharge: 2013-10-03 | Disposition: A | Discharge status: Home / Self Care | Payer: Medicare Other | Attending: Emergency Medicine | Admitting: Emergency Medicine

## 2013-10-03 DIAGNOSIS — Z90710 Acquired absence of both cervix and uterus: Secondary | ICD-10-CM | POA: Insufficient documentation

## 2013-10-03 DIAGNOSIS — K219 Gastro-esophageal reflux disease without esophagitis: Secondary | ICD-10-CM | POA: Insufficient documentation

## 2013-10-03 DIAGNOSIS — Z859 Personal history of malignant neoplasm, unspecified: Secondary | ICD-10-CM | POA: Insufficient documentation

## 2013-10-03 DIAGNOSIS — H1130 Conjunctival hemorrhage, unspecified eye: Secondary | ICD-10-CM | POA: Insufficient documentation

## 2013-10-03 DIAGNOSIS — Z7902 Long term (current) use of antithrombotics/antiplatelets: Secondary | ICD-10-CM | POA: Insufficient documentation

## 2013-10-03 DIAGNOSIS — Z8673 Personal history of transient ischemic attack (TIA), and cerebral infarction without residual deficits: Secondary | ICD-10-CM | POA: Insufficient documentation

## 2013-10-03 DIAGNOSIS — Z792 Long term (current) use of antibiotics: Secondary | ICD-10-CM | POA: Insufficient documentation

## 2013-10-03 DIAGNOSIS — I10 Essential (primary) hypertension: Secondary | ICD-10-CM | POA: Insufficient documentation

## 2013-10-03 DIAGNOSIS — E785 Hyperlipidemia, unspecified: Secondary | ICD-10-CM | POA: Insufficient documentation

## 2013-10-03 DIAGNOSIS — M129 Arthropathy, unspecified: Secondary | ICD-10-CM | POA: Insufficient documentation

## 2013-10-03 DIAGNOSIS — I2510 Atherosclerotic heart disease of native coronary artery without angina pectoris: Secondary | ICD-10-CM | POA: Insufficient documentation

## 2013-10-03 DIAGNOSIS — R51 Headache: Secondary | ICD-10-CM | POA: Insufficient documentation

## 2013-10-03 DIAGNOSIS — H1132 Conjunctival hemorrhage, left eye: Secondary | ICD-10-CM

## 2013-10-03 DIAGNOSIS — Z79899 Other long term (current) drug therapy: Secondary | ICD-10-CM | POA: Insufficient documentation

## 2013-10-03 DIAGNOSIS — R519 Headache, unspecified: Secondary | ICD-10-CM

## 2013-10-03 DIAGNOSIS — IMO0002 Reserved for concepts with insufficient information to code with codable children: Secondary | ICD-10-CM | POA: Insufficient documentation

## 2013-10-03 LAB — BASIC METABOLIC PANEL
Anion gap: 12 (ref 5–15)
BUN: 19 mg/dL (ref 6–23)
CO2: 27 meq/L (ref 19–32)
Calcium: 10 mg/dL (ref 8.4–10.5)
Chloride: 103 mEq/L (ref 96–112)
Creatinine, Ser: 1.2 mg/dL — ABNORMAL HIGH (ref 0.50–1.10)
GFR calc Af Amer: 45 mL/min — ABNORMAL LOW (ref >90)
GFR calc non Af Amer: 39 mL/min — ABNORMAL LOW (ref >90)
Glucose, Bld: 98 mg/dL (ref 70–99)
Potassium: 4.1 mEq/L (ref 3.7–5.3)
Sodium: 142 mEq/L (ref 137–147)

## 2013-10-03 LAB — CBC WITH DIFFERENTIAL/PLATELET
Basophils Absolute: 0 10*3/uL (ref 0.0–0.1)
Basophils Relative: 0 % (ref 0–1)
Eosinophils Absolute: 0.4 10*3/uL (ref 0.0–0.7)
Eosinophils Relative: 5 % (ref 0–5)
HEMATOCRIT: 37.4 % (ref 36.0–46.0)
Hemoglobin: 12.5 g/dL (ref 12.0–15.0)
LYMPHS PCT: 40 % (ref 12–46)
Lymphs Abs: 2.9 10*3/uL (ref 0.7–4.0)
MCH: 31.1 pg (ref 26.0–34.0)
MCHC: 33.4 g/dL (ref 30.0–36.0)
MCV: 93 fL (ref 78.0–100.0)
MONO ABS: 0.6 10*3/uL (ref 0.1–1.0)
Monocytes Relative: 8 % (ref 3–12)
Neutro Abs: 3.5 10*3/uL (ref 1.7–7.7)
Neutrophils Relative %: 47 % (ref 43–77)
Platelets: 186 10*3/uL (ref 150–400)
RBC: 4.02 MIL/uL (ref 3.87–5.11)
RDW: 12.8 % (ref 11.5–15.5)
WBC: 7.4 10*3/uL (ref 4.0–10.5)

## 2013-10-03 LAB — SEDIMENTATION RATE: Sed Rate: 19 mm/hr (ref 0–22)

## 2013-10-03 MED ORDER — FLUORESCEIN SODIUM 1 MG OP STRP
1.0000 | ORAL_STRIP | Freq: Once | OPHTHALMIC | Status: AC
  Administered 2013-10-03: 1 via OPHTHALMIC

## 2013-10-03 MED ORDER — TETRACAINE HCL 0.5 % OP SOLN
1.0000 [drp] | Freq: Once | OPHTHALMIC | Status: AC
  Administered 2013-10-03: 1 [drp] via OPHTHALMIC

## 2013-10-03 MED ORDER — ACETAMINOPHEN 325 MG PO TABS
650.0000 mg | ORAL_TABLET | Freq: Once | ORAL | Status: AC
  Administered 2013-10-03: 650 mg via ORAL
  Filled 2013-10-03: qty 2

## 2013-10-03 MED ORDER — METOCLOPRAMIDE HCL 5 MG/ML IJ SOLN
5.0000 mg | Freq: Once | INTRAMUSCULAR | Status: AC
  Administered 2013-10-03: 5 mg via INTRAVENOUS
  Filled 2013-10-03: qty 2

## 2013-10-03 MED ORDER — DIPHENHYDRAMINE HCL 50 MG/ML IJ SOLN
12.5000 mg | Freq: Once | INTRAMUSCULAR | Status: AC
  Administered 2013-10-03: 12.5 mg via INTRAVENOUS
  Filled 2013-10-03: qty 1

## 2013-10-03 MED ORDER — SODIUM CHLORIDE 0.9 % IV BOLUS (SEPSIS)
500.0000 mL | Freq: Once | INTRAVENOUS | Status: AC
  Administered 2013-10-03: 500 mL via INTRAVENOUS

## 2013-10-03 MED ORDER — FLUORESCEIN SODIUM 1 MG OP STRP
ORAL_STRIP | OPHTHALMIC | Status: AC
  Filled 2013-10-03: qty 1

## 2013-10-03 MED ORDER — TETRACAINE HCL 0.5 % OP SOLN
OPHTHALMIC | Status: AC
  Filled 2013-10-03: qty 2

## 2013-10-03 NOTE — Discharge Instructions (Signed)
Subconjunctival Hemorrhage Your exam shows you have a subconjunctival hemorrhage. This is a harmless collection of blood covering a portion of the white of the eye. This condition may be due to injury or to straining (lifting, sneezing, or coughing). Often, there is no known cause. Subconjunctival blood does not cause pain or vision problems. This condition needs no treatment. It will take 1 to 2 weeks for the blood to dissolve. If you take aspirin or Coumadin on a daily basis or if you have high blood pressure, you should check with your doctor about the need for further treatment. Please call your doctor if you have problems with your vision, pain around the eye, or any other concerns about your condition. Document Released: 04/12/2004 Document Revised: 05/28/2011 Document Reviewed: 01/31/2009 [employer] Patient Information 2015 [employer]. This information is not intended to replace advice given to you by your health care provider. Make sure you discuss any questions you have with your health care provider.  

## 2013-10-03 NOTE — ED Notes (Signed)
Bedside report received from [place], [place], assumed care of patient at this time.

## 2013-10-03 NOTE — ED Provider Notes (Signed)
CSN: [id number]     Arrival date & time 10/03/13  1230 History   First MD Initiated Contact with Patient 10/03/13 1301     Chief Complaint  Patient presents with  . Left Eye Pain      (Consider location/radiation/quality/duration/timing/severity/associated sxs/prior Treatment) Patient is a 80 y.o. female presenting with eye pain. The history is provided by the patient. No language interpreter was used.  Eye Pain This is a new problem. The current episode started in the past 7 days. Associated symptoms include headaches. Pertinent negatives include no abdominal pain, fever, myalgias, vomiting or weakness. Associated symptoms comments: Sudden onset of left eye pain 3 days ago that is now causing left temporal headache. No vomiting, change in vision, weakness, fever. No trauma to the eye. She reports "bleeding" into the eye that has gotten worse since symptoms began. .    Past Medical History  Diagnosis Date  . Hypertension   . GERD (gastroesophageal reflux disease)   . Stroke   . Arthritis   . Cancer   . Coronary artery disease   . Hyperlipidemia   . H/O: hysterectomy    Past Surgical History  Procedure Laterality Date  . Multiple surgeries r/t cancer in legs    . Abdominal hysterectomy    . Tonsillectomy    . Appendectomy     No family history on file. History  Substance Use Topics  . Smoking status: Never Smoker   . Smokeless tobacco: Not on file  . Alcohol Use: No   OB History   Grav Para Term Preterm Abortions TAB SAB Ect Mult Living                 Review of Systems  Constitutional: Negative for fever.  HENT: Negative for sinus pressure.   Eyes: Positive for pain and redness. Negative for visual disturbance.  Respiratory: Negative for shortness of breath.   Gastrointestinal: Negative for vomiting and abdominal pain.  Musculoskeletal: Negative for myalgias.  Neurological: Positive for headaches. Negative for facial asymmetry, speech difficulty and weakness.       Allergies  Iohexol; Shellfish allergy; Aspirin; Codeine; Sulfa antibiotics; and Tramadol  Home Medications   Prior to Admission medications   Medication Sig Start Date End Date Taking? Authorizing Provider  acetaminophen (TYLENOL) 500 MG tablet Take 500 mg by mouth 2 (two) times daily.     Historical Provider, MD  amLODipine (NORVASC) 10 MG tablet Take 10 mg by mouth daily.      Historical Provider, MD  atenolol (TENORMIN) 50 MG tablet Take 75 mg by mouth 2 (two) times daily.     Historical Provider, MD  cephALEXin (KEFLEX) 500 MG capsule Take 1 capsule (500 mg total) by mouth 3 (three) times daily. 04/19/13   Shaune Pollack, MD  clobetasol (TEMOVATE) 0.05 % ointment Apply topically 2 (two) times daily.      Historical Provider, MD  clopidogrel (PLAVIX) 75 MG tablet Take 75 mg by mouth daily.      Historical Provider, MD  Multiple Vitamin (MULTIVITAMIN) tablet Take 1 tablet by mouth daily.      Historical Provider, MD  olmesartan-hydrochlorothiazide (BENICAR HCT) 40-25 MG per tablet Take 1 tablet by mouth daily.      Historical Provider, MD  pantoprazole (PROTONIX) 40 MG tablet Take 40 mg by mouth daily.      Historical Provider, MD  Pitavastatin Calcium (LIVALO) 4 MG TABS Take 1 tablet by mouth daily.      Historical Provider, MD  potassium chloride (KLOR-CON) 10 MEQ CR tablet Take 10 mEq by mouth 2 (two) times daily.      Historical Provider, MD  pravastatin (PRAVACHOL) 80 MG tablet Take 80 mg by mouth at bedtime.    Historical Provider, MD   BP 168/81  Pulse 65  Temp(Src) 98 F (36.7 C) (Oral)  Resp 18  SpO2 98% Physical Exam  Constitutional: She is oriented to person, place, and time. She appears well-developed and well-nourished.  HENT:  Head: Normocephalic.  No significant left temporal tenderness. No temporal arterial bruit.   Eyes: Pupils are equal, round, and reactive to light.  Marked subconjunctival hemorrhage of left eye. No hyphema. Fluorescein staining negative  for corneal uptake. Lids without redness of swelling. Right eye unremarkable in appearance. Tonometric measurement 15, and 13, left eye.  Neck: Normal range of motion. Neck supple.  Cardiovascular: Normal rate and regular rhythm.   Pulmonary/Chest: Effort normal and breath sounds normal.  Abdominal: Soft. Bowel sounds are normal. There is no tenderness. There is no rebound and no guarding.  Musculoskeletal: Normal range of motion.  Neurological: She is alert and oriented to person, place, and time. She has normal strength and normal reflexes. No sensory deficit. She displays a negative Romberg sign. Coordination normal.  No lateralizing weakness of extremities. No facial asymmetry or speech difficulty. CN's 3-12 grossly intact.   Skin: Skin is warm and dry. No rash noted.  Psychiatric: She has a normal mood and affect.    ED Course  Procedures (including critical care time) Labs Review Labs Reviewed  BASIC METABOLIC PANEL - Abnormal; Notable for the following:    Creatinine, Ser 1.20 (*)    GFR calc non Af Amer 39 (*)    GFR calc Af Amer 45 (*)    All other components within normal limits  SEDIMENTATION RATE  CBC WITH DIFFERENTIAL    Imaging Review Ct Head Wo Contrast  10/03/2013   CLINICAL DATA:  Left eye pain and left temporal headache for the past 2 days. Erythema involving the left eye.  EXAM: CT HEAD WITHOUT CONTRAST  TECHNIQUE: Contiguous axial images were obtained from the base of the skull through the vertex without intravenous contrast.  COMPARISON:  None.  FINDINGS: Moderate age related cortical, deep and cerebellar atrophy. Mild changes of small vessel disease of the white matter diffusely. No mass lesion. No midline shift. No acute hemorrhage or hematoma. No extra-axial fluid collections. No evidence of acute infarction.  No skull fracture or other focal osseous abnormality involving the skull. Visualized paranasal sinuses, bilateral mastoid air cells and bilateral middle ear  cavities well-aerated. Mild bilateral carotid siphon and left vertebral artery atherosclerosis. Calcification anteriorly at the C1 level just below the foramen magnum.  IMPRESSION: 1. No acute intracranial abnormality. 2. Moderate generalized atrophy and mild chronic microvascular ischemic changes of the white matter. 3. Calcification anteriorly at the C1 level just below the foramen magnum, possibly calcified pannus at the C1-C2 articulation.   Electronically Signed   By: Evangeline Dakin M.D.   On: 10/03/2013 14:03     EKG Interpretation None      MDM   Final diagnoses:  None    1. Left subconjunctival hemorrhage 2. Left eye pain 3. Nonspecific headache  She complains that the headache is now located in bilateral frontal areas. Tylenol with some relief. Sed Rate negative, pain location changing, negative exam of left eye except for hemorrhage as described. She appears stable. CT head done secondary to headache  in the setting of multiple strokes in the past and is nonacute.     Dewaine Oats, PA-C 10/03/13 1616

## 2013-10-03 NOTE — ED Notes (Signed)
C/o left eye pain onset Thursday with eye redness and left temporal headache. Denies vision deficit. Pupil equal and reactive to light. Bp 173/73

## 2013-10-03 NOTE — ED Notes (Signed)
Vision: Right 20/30      Left 20/30  Reports the pain started when she was sewing on Thursday.

## 2013-10-04 NOTE — ED Provider Notes (Signed)
Medical screening examination/treatment/procedure(s) were conducted as a shared visit with non-physician practitioner(s) and myself.  I personally evaluated the patient during the encounter.   EKG Interpretation None      I interviewed and examined the patient. Lungs are CTAB. Cardiac exam wnl. Abdomen soft.  Subconjunctival hem to left eye. Pt has hx of HA's. No temporal artery ttp on my exam. No jaw claudication. No significant change in vision per pt. Pt notes relief in eye pain w/ tetracaine but no abrasion seen. ESR normal. CT head neg. Normal tonometric pressure. Doubt GCA, CVA, SAH. Otherwise normal neuro exam performed by PA. Pt well appearing. HA resolving. Will rec f/u w/ pcp or return for worsening.   Blanchard Kelch, MD 10/04/13 [phone]

## 2017-09-28 ENCOUNTER — Encounter (HOSPITAL_BASED_OUTPATIENT_CLINIC_OR_DEPARTMENT_OTHER): Payer: Self-pay | Admitting: Emergency Medicine

## 2017-09-28 ENCOUNTER — Emergency Department (HOSPITAL_BASED_OUTPATIENT_CLINIC_OR_DEPARTMENT_OTHER): Payer: Medicare Other

## 2017-09-28 ENCOUNTER — Emergency Department (HOSPITAL_BASED_OUTPATIENT_CLINIC_OR_DEPARTMENT_OTHER)
Admission: EM | Admit: 2017-09-28 | Discharge: 2017-09-28 | Disposition: A | Discharge status: Home / Self Care | Payer: Medicare Other | Attending: Emergency Medicine | Admitting: Emergency Medicine

## 2017-09-28 ENCOUNTER — Other Ambulatory Visit: Payer: Self-pay

## 2017-09-28 DIAGNOSIS — Z859 Personal history of malignant neoplasm, unspecified: Secondary | ICD-10-CM | POA: Insufficient documentation

## 2017-09-28 DIAGNOSIS — Z79899 Other long term (current) drug therapy: Secondary | ICD-10-CM | POA: Insufficient documentation

## 2017-09-28 DIAGNOSIS — I10 Essential (primary) hypertension: Secondary | ICD-10-CM | POA: Insufficient documentation

## 2017-09-28 DIAGNOSIS — I2510 Atherosclerotic heart disease of native coronary artery without angina pectoris: Secondary | ICD-10-CM | POA: Diagnosis not present

## 2017-09-28 DIAGNOSIS — R05 Cough: Secondary | ICD-10-CM | POA: Diagnosis present

## 2017-09-28 DIAGNOSIS — Z8673 Personal history of transient ischemic attack (TIA), and cerebral infarction without residual deficits: Secondary | ICD-10-CM | POA: Insufficient documentation

## 2017-09-28 DIAGNOSIS — J45901 Unspecified asthma with (acute) exacerbation: Secondary | ICD-10-CM

## 2017-09-28 DIAGNOSIS — R059 Cough, unspecified: Secondary | ICD-10-CM

## 2017-09-28 MED ORDER — ALBUTEROL SULFATE (2.5 MG/3ML) 0.083% IN NEBU
INHALATION_SOLUTION | RESPIRATORY_TRACT | Status: AC
  Administered 2017-09-28: 2.5 mg via RESPIRATORY_TRACT
  Filled 2017-09-28: qty 3

## 2017-09-28 MED ORDER — IPRATROPIUM-ALBUTEROL 0.5-2.5 (3) MG/3ML IN SOLN
3.0000 mL | Freq: Once | RESPIRATORY_TRACT | Status: AC
  Administered 2017-09-28: 3 mL via RESPIRATORY_TRACT

## 2017-09-28 MED ORDER — PREDNISONE 20 MG PO TABS: ORAL_TABLET | ORAL | 0 refills | Status: DC

## 2017-09-28 MED ORDER — ALBUTEROL SULFATE (2.5 MG/3ML) 0.083% IN NEBU
5.0000 mg | INHALATION_SOLUTION | Freq: Once | RESPIRATORY_TRACT | Status: AC
  Administered 2017-09-28: 5 mg via RESPIRATORY_TRACT
  Filled 2017-09-28: qty 6

## 2017-09-28 MED ORDER — PREDNISONE 50 MG PO TABS
60.0000 mg | ORAL_TABLET | Freq: Once | ORAL | Status: AC
  Administered 2017-09-28: 60 mg via ORAL
  Filled 2017-09-28: qty 1

## 2017-09-28 MED ORDER — IPRATROPIUM-ALBUTEROL 0.5-2.5 (3) MG/3ML IN SOLN
RESPIRATORY_TRACT | Status: AC
  Administered 2017-09-28: 3 mL via RESPIRATORY_TRACT
  Filled 2017-09-28: qty 3

## 2017-09-28 MED ORDER — ALBUTEROL SULFATE (2.5 MG/3ML) 0.083% IN NEBU
2.5000 mg | INHALATION_SOLUTION | Freq: Once | RESPIRATORY_TRACT | Status: AC
  Administered 2017-09-28: 2.5 mg via RESPIRATORY_TRACT

## 2017-09-28 NOTE — ED Triage Notes (Signed)
Patient has had a cough x 2 -3 days - states that it got worse last night

## 2017-09-28 NOTE — ED Provider Notes (Addendum)
[facility] Provider Note   CSN: [id number] Arrival date & time: 09/28/17  1221     History   Chief Complaint Chief Complaint  Patient presents with  . Cough    HPI Castella HANI PATNODE is a 84 y.o. female.  Patient is a 84 year old female who presents with cough and wheezing.  She states she has had a cough for about 3 to 4 days.  She started noticed some increased wheezing and shortness of breath starting yesterday.  She coughed most of the night and did not get much sleep.  She denies any chest pain.  Her cough is productive of some yellow sputum.  She also has a little bit of associated rhinorrhea.  She denies any chest pain.  No leg pain or swelling.  She does say that she was recently exposed to some cut grass when someone motor yard and feels like that might have worsened her asthma.  She does have a history of asthma and takes nebulizer treatments at home.  She has been using her nebulizer machine with out significant improvement in symptoms.     Past Medical History:  Diagnosis Date  . Arthritis   . Cancer ([place])   . Coronary artery disease   . GERD (gastroesophageal reflux disease)   . H/O: hysterectomy   . Hyperlipidemia   . Hypertension   . Stroke [facility])     Patient Active Problem List   Diagnosis Date Noted  . Coronary artery disease 12/13/2010  . Hypertension 12/13/2010  . Chest pain 12/13/2010  . Hyperlipidemia 12/13/2010    Past Surgical History:  Procedure Laterality Date  . ABDOMINAL HYSTERECTOMY    . APPENDECTOMY    . multiple surgeries r/t cancer in legs    . TONSILLECTOMY       OB History   None      Home Medications    Prior to Admission medications   Medication Sig Start Date End Date Taking? Authorizing Provider  albuterol (PROVENTIL HFA;VENTOLIN HFA) 108 (90 Base) MCG/ACT inhaler INHALE 2 PUFFS INTO THE LUNGS EVERY 4 HOURS AS NEEDED FOR WHEEZING 08/07/17  Yes [provider]  atorvastatin  (LIPITOR) 40 MG tablet TAKE 1 TABLET BY MOUTH  EVERY DAY 08/28/17  Yes [provider]  budesonide (PULMICORT) 0.5 MG/2ML nebulizer solution Take 2 mLs (0.5 mg total) by nebulization 2 times daily. 08/07/17  Yes [provider]  furosemide (LASIX) 20 MG tablet TAKE 1 TABLET BY MOUTH  DAILY AS NEEDED FOR  SWELLING 06/17/17  Yes [provider]  pantoprazole (PROTONIX) 40 MG tablet TAKE 1 TABLET BY MOUTH TWICE DAILY 09/06/17  Yes [provider]  acetaminophen (TYLENOL) 500 MG tablet Take 500 mg by mouth 2 (two) times daily.     [provider]  amLODipine (NORVASC) 10 MG tablet Take 10 mg by mouth daily.      [provider]  atenolol (TENORMIN) 50 MG tablet Take 75 mg by mouth 2 (two) times daily.     [provider]  cephALEXin (KEFLEX) 500 MG capsule Take 1 capsule (500 mg total) by mouth 3 (three) times daily. 04/19/13   Pattricia Boss, MD  clobetasol (TEMOVATE) 0.05 % ointment Apply topically 2 (two) times daily.      [provider]  clopidogrel (PLAVIX) 75 MG tablet Take 75 mg by mouth daily.      [provider]  Multiple Vitamin (MULTIVITAMIN) tablet Take 1 tablet by mouth daily.  [provider]  pantoprazole (PROTONIX) 40 MG tablet Take 40 mg by mouth daily.      [provider]  Pitavastatin Calcium (LIVALO) 4 MG TABS Take 1 tablet by mouth daily.      [provider]  potassium chloride (KLOR-CON) 10 MEQ CR tablet Take 10 mEq by mouth 2 (two) times daily.      [provider]  pravastatin (PRAVACHOL) 80 MG tablet Take 80 mg by mouth at bedtime.    [provider]  predniSONE (DELTASONE) 20 MG tablet 2 tabs po daily x 4 days 09/28/17   Malvin Johns, MD    Family History History reviewed. No pertinent family history.  Social History Social History   Tobacco Use  . Smoking status: Never Smoker  . Smokeless tobacco: Never Used  Substance Use Topics  . Alcohol  use: No  . Drug use: Not on file     Allergies   Iohexol; Shellfish allergy; Aspirin; Codeine; Sulfa antibiotics; and Tramadol   Review of Systems Review of Systems  Constitutional: Positive for fatigue. Negative for chills, diaphoresis and fever.  HENT: Negative for congestion, rhinorrhea and sneezing.   Eyes: Negative.   Respiratory: Positive for cough, shortness of breath and wheezing. Negative for chest tightness.   Cardiovascular: Negative for chest pain and leg swelling.  Gastrointestinal: Negative for abdominal pain, blood in stool, diarrhea, nausea and vomiting.  Genitourinary: Negative for difficulty urinating, flank pain, frequency and hematuria.  Musculoskeletal: Negative for arthralgias and back pain.  Skin: Negative for rash.  Neurological: Negative for dizziness, speech difficulty, weakness, numbness and headaches.     Physical Exam Updated Vital Signs BP (!) 173/82 (BP Location: Right Arm)   Pulse 98   Temp 98.5 F (36.9 C) (Oral)   Resp 20   Ht 5\' 2"  (1.575 m)   Wt 71.2 kg (157 lb)   SpO2 97%   BMI 28.72 kg/m   Physical Exam  Constitutional: She is oriented to person, place, and time. She appears well-developed and well-nourished.  HENT:  Head: Normocephalic and atraumatic.  Eyes: Pupils are equal, round, and reactive to light.  Neck: Normal range of motion. Neck supple.  Cardiovascular: Normal rate, regular rhythm and normal heart sounds.  Pulmonary/Chest: Effort normal. No respiratory distress. She has wheezes. She has no rales. She exhibits no tenderness.  Diminished breath sounds bilaterally  Abdominal: Soft. Bowel sounds are normal. There is no tenderness. There is no rebound and no guarding.  Musculoskeletal: Normal range of motion. She exhibits no edema.  Lymphadenopathy:    She has no cervical adenopathy.  Neurological: She is alert and oriented to person, place, and time.  Skin: Skin is warm and dry. No rash noted.  Psychiatric: She has a  normal mood and affect.     ED Treatments / Results  Labs (all labs ordered are listed, but only abnormal results are displayed) Labs Reviewed - No data to display  EKG None  ED ECG REPORT   Date: 09/28/2017  Rate: 81  Rhythm: normal sinus rhythm  QRS Axis: normal  Intervals: normal  ST/T Wave abnormalities: nonspecific ST/T changes  Conduction Disutrbances:none  Narrative Interpretation:   Old EKG Reviewed: none available  I have personally reviewed the EKG tracing and agree with the computerized printout as noted.   Radiology Dg Chest 2 View  Result Date: 09/28/2017 CLINICAL DATA:  Cough.  History of asthma. EXAM: CHEST - 2 VIEW COMPARISON:  01/21/2017 FINDINGS: Aortic atherosclerosis noted. Heart  size normal. No pleural effusion or edema identified. No airspace opacities identified. Mild spondylosis noted within the thoracic spine. IMPRESSION: 1. No acute cardiopulmonary abnormalities. 2. Aortic atherosclerosis. Electronically Signed   By: Kerby Moors M.D.   On: 09/28/2017 13:32    Procedures Procedures (including critical care time)  Medications Ordered in ED Medications  albuterol (PROVENTIL) (2.5 MG/3ML) 0.083% nebulizer solution 2.5 mg (2.5 mg Nebulization Given 09/28/17 1248)  ipratropium-albuterol (DUONEB) 0.5-2.5 (3) MG/3ML nebulizer solution 3 mL (3 mLs Nebulization Given 09/28/17 1248)  predniSONE (DELTASONE) tablet 60 mg (60 mg Oral Given 09/28/17 1342)  albuterol (PROVENTIL) (2.5 MG/3ML) 0.083% nebulizer solution 5 mg (5 mg Nebulization Given 09/28/17 1346)     Initial Impression / Assessment and Plan / ED Course  I have reviewed the triage vital signs and the nursing notes.  Pertinent labs & imaging results that were available during my care of the patient were reviewed by me and considered in my medical decision making (see chart for details).    Patient is a 84 year old female with a history of asthma and wheezing who presents with worsening cough and  wheezing.  She was exposed to some allergens recently.  She has no fever.  No evidence of pneumonia on x-ray.  She was given a dose of prednisone in the ED and some nebulizer treatments.  She is feeling much better and is talking in full sentences.  She states her breathing is much better.  She is able to ambulate without dyspnea or hypoxia.  She was discharged home in good condition.  She was given prescriptions for prednisone for 5-day course.  She was encouraged to use her albuterol nebulizers every 4-6 hours for the next few days.  She will continue her Pulmocort Her granddaughter who is with her will arrange for her to follow-up with her PCP in 2 days.  Return precautions were given.   Final Clinical Impressions(s) / ED Diagnoses   Final diagnoses:  Cough  Moderate asthma with exacerbation, unspecified whether persistent    ED Discharge Orders        Ordered    predniSONE (DELTASONE) 20 MG tablet     09/28/17 1428       Malvin Johns, MD 09/28/17 1443    Malvin Johns, MD 09/28/17 1530

## 2017-12-08 ENCOUNTER — Encounter (HOSPITAL_BASED_OUTPATIENT_CLINIC_OR_DEPARTMENT_OTHER): Payer: Self-pay | Admitting: *Deleted

## 2017-12-08 ENCOUNTER — Emergency Department (HOSPITAL_BASED_OUTPATIENT_CLINIC_OR_DEPARTMENT_OTHER)
Admission: EM | Admit: 2017-12-08 | Discharge: 2017-12-08 | Disposition: A | Discharge status: Home / Self Care | Payer: Medicare Other | Attending: Emergency Medicine | Admitting: Emergency Medicine

## 2017-12-08 ENCOUNTER — Other Ambulatory Visit: Payer: Self-pay

## 2017-12-08 DIAGNOSIS — I2510 Atherosclerotic heart disease of native coronary artery without angina pectoris: Secondary | ICD-10-CM | POA: Insufficient documentation

## 2017-12-08 DIAGNOSIS — R55 Syncope and collapse: Secondary | ICD-10-CM | POA: Diagnosis present

## 2017-12-08 DIAGNOSIS — E876 Hypokalemia: Secondary | ICD-10-CM

## 2017-12-08 DIAGNOSIS — N3000 Acute cystitis without hematuria: Secondary | ICD-10-CM

## 2017-12-08 DIAGNOSIS — I10 Essential (primary) hypertension: Secondary | ICD-10-CM | POA: Diagnosis not present

## 2017-12-08 LAB — URINALYSIS, ROUTINE W REFLEX MICROSCOPIC
Bilirubin Urine: NEGATIVE
GLUCOSE, UA: NEGATIVE mg/dL
KETONES UR: NEGATIVE mg/dL
Nitrite: NEGATIVE
PROTEIN: NEGATIVE mg/dL
Specific Gravity, Urine: 1.01 (ref 1.005–1.030)
pH: 5.5 (ref 5.0–8.0)

## 2017-12-08 LAB — URINALYSIS, MICROSCOPIC (REFLEX)

## 2017-12-08 LAB — CBC
HCT: 41.2 % (ref 36.0–46.0)
Hemoglobin: 13.8 g/dL (ref 12.0–15.0)
MCH: 30.7 pg (ref 26.0–34.0)
MCHC: 33.5 g/dL (ref 30.0–36.0)
MCV: 91.6 fL (ref 78.0–100.0)
Platelets: 204 10*3/uL (ref 150–400)
RBC: 4.5 MIL/uL (ref 3.87–5.11)
RDW: 13 % (ref 11.5–15.5)
WBC: 7.6 10*3/uL (ref 4.0–10.5)

## 2017-12-08 LAB — BASIC METABOLIC PANEL
Anion gap: 10 (ref 5–15)
BUN: 19 mg/dL (ref 8–23)
CHLORIDE: 104 mmol/L (ref 98–111)
CO2: 26 mmol/L (ref 22–32)
CREATININE: 1.3 mg/dL — AB (ref 0.44–1.00)
Calcium: 9.2 mg/dL (ref 8.9–10.3)
GFR calc Af Amer: 40 mL/min — ABNORMAL LOW (ref >60)
GFR calc non Af Amer: 35 mL/min — ABNORMAL LOW (ref >60)
Glucose, Bld: 157 mg/dL — ABNORMAL HIGH (ref 70–99)
Potassium: 3.1 mmol/L — ABNORMAL LOW (ref 3.5–5.1)
Sodium: 140 mmol/L (ref 135–145)

## 2017-12-08 LAB — TROPONIN I

## 2017-12-08 LAB — CBG MONITORING, ED: Glucose-Capillary: 149 mg/dL — ABNORMAL HIGH (ref 70–99)

## 2017-12-08 MED ORDER — POTASSIUM CHLORIDE CRYS ER 20 MEQ PO TBCR
40.0000 meq | EXTENDED_RELEASE_TABLET | Freq: Once | ORAL | Status: AC
  Administered 2017-12-08: 40 meq via ORAL
  Filled 2017-12-08: qty 2

## 2017-12-08 MED ORDER — POTASSIUM CHLORIDE CRYS ER 20 MEQ PO TBCR: 20.0000 meq | EXTENDED_RELEASE_TABLET | Freq: Every day | ORAL | 0 refills | Status: AC

## 2017-12-08 MED ORDER — CEPHALEXIN 250 MG PO CAPS
1000.0000 mg | ORAL_CAPSULE | Freq: Once | ORAL | Status: AC
  Administered 2017-12-08: 1000 mg via ORAL
  Filled 2017-12-08: qty 4

## 2017-12-08 MED ORDER — CEPHALEXIN 500 MG PO CAPS: 1000.0000 mg | ORAL_CAPSULE | Freq: Two times a day (BID) | ORAL | 0 refills | Status: DC

## 2017-12-08 NOTE — ED Notes (Signed)
Pt sts "I feel fine and I'm ready to go home." Advised pt and family that MD would be with patient as soon as possible.

## 2017-12-08 NOTE — ED Provider Notes (Signed)
[facility] EMERGENCY DEPARTMENT Provider Note   CSN: [id number] Arrival date & time: 12/08/17  5621     History   Chief Complaint Chief Complaint  Patient presents with  . Loss of Consciousness    HPI Wendy Lucas is a 84 y.o. female.  HPI Reports she was feeling well all day.  She went to church and then she was shopping at the grocery store with a friend.  It already checked out and all of a sudden felt very lightheaded like she was close to passing out.  She did not have associated pain.  She did tell her friend that she felt this coming on but her friend was unable to catch her.  Patient reports that she did fall to the ground but was not injured.  She reports she did hit her knee slightly.  She reports that she was waking up almost as soon as she passed out.  She was immediately aware of her surroundings.  She did not have chest pain or headache.  Patient went home and thought she ate she might feel better.  Reports she continued to just feel a little generally weak and not well..  She never developed a headache, chest pain, shortness of breath, focal weakness numbness or tingling.  no Recent fever chills, nausea, vomiting, urinary symptoms. Past Medical History:  Diagnosis Date  . Arthritis   . Cancer ([place])   . Coronary artery disease   . GERD (gastroesophageal reflux disease)   . H/O: hysterectomy   . Hyperlipidemia   . Hypertension   . Stroke [facility])     Patient Active Problem List   Diagnosis Date Noted  . Coronary artery disease 12/13/2010  . Hypertension 12/13/2010  . Chest pain 12/13/2010  . Hyperlipidemia 12/13/2010    Past Surgical History:  Procedure Laterality Date  . ABDOMINAL HYSTERECTOMY    . APPENDECTOMY    . multiple surgeries r/t cancer in legs    . TONSILLECTOMY       OB History   None      Home Medications    Prior to Admission medications   Medication Sig Start Date End Date Taking? Authorizing Provider  acetaminophen  (TYLENOL) 500 MG tablet Take 500 mg by mouth 2 (two) times daily.     [provider]  albuterol (PROVENTIL HFA;VENTOLIN HFA) 108 (90 Base) MCG/ACT inhaler INHALE 2 PUFFS INTO THE LUNGS EVERY 4 HOURS AS NEEDED FOR WHEEZING 08/07/17   [provider]  amLODipine (NORVASC) 10 MG tablet Take 10 mg by mouth daily.      [provider]  atenolol (TENORMIN) 50 MG tablet Take 75 mg by mouth 2 (two) times daily.     [provider]  atorvastatin (LIPITOR) 40 MG tablet TAKE 1 TABLET BY MOUTH  EVERY DAY 08/28/17   [provider]  budesonide (PULMICORT) 0.5 MG/2ML nebulizer solution Take 2 mLs (0.5 mg total) by nebulization 2 times daily. 08/07/17   [provider]  cephALEXin (KEFLEX) 500 MG capsule Take 1 capsule (500 mg total) by mouth 3 (three) times daily. 04/19/13   Pattricia Boss, MD  cephALEXin (KEFLEX) 500 MG capsule Take 2 capsules (1,000 mg total) by mouth 2 (two) times daily. 12/08/17   Charlesetta Shanks, MD  clobetasol (TEMOVATE) 0.05 % ointment Apply topically 2 (two) times daily.      [provider]  clopidogrel (PLAVIX) 75 MG tablet Take 75 mg by mouth daily.      [provider]  furosemide (LASIX) 20 MG tablet TAKE 1 TABLET BY MOUTH  DAILY AS NEEDED FOR  SWELLING 06/17/17   [provider]  Multiple Vitamin (MULTIVITAMIN) tablet Take 1 tablet by mouth daily.      [provider]  pantoprazole (PROTONIX) 40 MG tablet Take 40 mg by mouth daily.      [provider]  pantoprazole (PROTONIX) 40 MG tablet TAKE 1 TABLET BY MOUTH TWICE DAILY 09/06/17   [provider]  Pitavastatin Calcium (LIVALO) 4 MG TABS Take 1 tablet by mouth daily.      [provider]  potassium chloride (KLOR-CON) 10 MEQ CR tablet Take 10 mEq by mouth 2 (two) times daily.      [provider]  potassium chloride SA (K-DUR,KLOR-CON) 20 MEQ tablet Take 1 tablet (20 mEq total) by mouth daily. 12/08/17   Charlesetta Shanks, MD  pravastatin (PRAVACHOL) 80 MG tablet Take 80 mg by mouth at bedtime.    [provider]  predniSONE (DELTASONE) 20 MG tablet 2 tabs po daily x 4 days 09/28/17   Malvin Johns, MD    Family History No family history on file.  Social History Social History   Tobacco Use  . Smoking status: Never Smoker  . Smokeless tobacco: Never Used  Substance Use Topics  . Alcohol use: No  . Drug use: Not on file     Allergies   Iohexol; Shellfish allergy; Aspirin; Codeine; Sulfa antibiotics; and Tramadol   Review of Systems Review of Systems 10 Systems reviewed and are negative for acute change except as noted in the HPI.   Physical Exam Updated Vital Signs BP (!) 168/74   Pulse 69   Temp 98.5 F (36.9 C) (Oral)   Resp 13   Ht 5\' 4"  (1.626 m)   Wt 71.2 kg   SpO2 99%   BMI 26.94 kg/m   Physical Exam  Constitutional: She is oriented to person, place, and time.  Patient is alert and clinically well in appearance.  She is sitting at the edge of the stretcher.  No respiratory distress.  GCS 15.  HENT:  Head: Normocephalic and atraumatic.  Nose: Nose normal.  Mouth/Throat: Oropharynx is clear and moist.  Eyes: Pupils are equal, round, and reactive to light. EOM are normal.  Neck: Neck supple.  Cardiovascular: Normal rate, regular rhythm, normal heart sounds and intact distal pulses.  Pulmonary/Chest: Effort normal and breath sounds normal.  Abdominal: Soft. She exhibits no distension. There is no tenderness. There is no guarding.  Musculoskeletal: Normal range of motion. She exhibits no edema or tenderness.  Neurological: She is alert and oriented to person, place, and time. No cranial nerve deficit or sensory deficit. She exhibits normal muscle tone. Coordination normal.  Movements are coordinated purposeful symmetric.  Patient has very subtle decreased right motor strength upper and lower extremity (patient reports this is her baseline from very distant prior  stroke).  Patient can elevate and hold both lower extremities off of the bed.  She can resist downward pressure with both but advises the right is slightly weaker which would be her baseline.  Skin: Skin is warm and dry.  Psychiatric: She has a normal mood and affect.     ED Treatments / Results  Labs (all labs ordered are listed, but only abnormal results are displayed) Labs Reviewed  BASIC METABOLIC PANEL - Abnormal; Notable for the following components:      Result Value   Potassium 3.1 (*)  Glucose, Bld 157 (*)    Creatinine, Ser 1.30 (*)    GFR calc non Af Amer 35 (*)    GFR calc Af Amer 40 (*)    All other components within normal limits  URINALYSIS, ROUTINE W REFLEX MICROSCOPIC - Abnormal; Notable for the following components:   Hgb urine dipstick SMALL (*)    Leukocytes, UA SMALL (*)    All other components within normal limits  URINALYSIS, MICROSCOPIC (REFLEX) - Abnormal; Notable for the following components:   Bacteria, UA MANY (*)    All other components within normal limits  CBG MONITORING, ED - Abnormal; Notable for the following components:   Glucose-Capillary 149 (*)    All other components within normal limits  URINE CULTURE  CBC  TROPONIN I    EKG EKG Interpretation  Date/Time:  "Sunday December 08 2017 18:34:55 EDT Ventricular Rate:  75 PR Interval:    QRS Duration: 96 QT Interval:  410 QTC Calculation: 458 R Axis:   51 Text Interpretation:  Sinus rhythm Baseline wander in lead(s) I III aVL normal, no change from previous Confirmed by Josephina Melcher ([id number]) on 12/08/2017 6:40:58 PM   Radiology No results found.  Procedures Procedures (including critical care time)  Medications Ordered in ED Medications  cephALEXin (KEFLEX) capsule 1,000 mg (has no administration in time range)  potassium chloride SA (K-DUR,KLOR-CON) CR tablet 40 mEq (40 mEq Oral Given 12/08/17 2031)     Initial Impression / Assessment and Plan / ED Course  I have reviewed  the triage vital signs and the nursing notes.  Pertinent labs & imaging results that were available during my care of the patient were reviewed by me and considered in my medical decision making (see chart for details).     Patient is clinically well in appearance.  EKG normal troponin negative.  Patient syncopal episode without any neurologic dysfunction.  It was very brief and without mental status change or associated pain.  She was shopping and had been standing for a while.  I have some suspicion for orthostatic hypotension at the time.  Patient is mildly hypokalemic, she does not recall she has been taking potassium that was previously prescribed by review of EMR.  Instructions are to take 20 mEq a day until the patient follows up with her PCP this week.  Also o patient's urinalysis does test positive for leukocytes and bacteria with a good specimen.  Patient has had some general malaise today, will opt to treat this with Keflex.  Patient is clinically well in appearance and stable for discharge at this time.  She has her family member with her who will be with her at all times.  They will return if any problems or new symptoms develop and follow-up with PCP this week. Final Clinical Impressions(s) / ED Diagnoses   Final diagnoses:  Syncope and collapse  Hypokalemia  Acute cystitis without hematuria    ED Discharge Orders         Ordered    cephALEXin (KEFLEX) 500 MG capsule  2 times daily     12/08/17 2125    potassium chloride SA (K-DUR,KLOR-CON) 20 MEQ tablet  Daily     09" /22/19 2125           Charlesetta Shanks, MD 12/08/17 2128

## 2017-12-08 NOTE — ED Triage Notes (Signed)
Family member reports pt had syncopal episode 2 hours ago in grocery store. States pt went home to eat but still isn't feeling good

## 2017-12-11 LAB — URINE CULTURE: Culture: 10000 — AB

## 2017-12-12 ENCOUNTER — Telehealth: Payer: Self-pay | Admitting: *Deleted

## 2017-12-12 NOTE — Telephone Encounter (Signed)
Post ED Visit - Positive Culture Follow-up  Culture report reviewed by antimicrobial stewardship pharmacist:  []  Elenor Quinones, Pharm.D. []  Heide Guile, Pharm.D., BCPS AQ-ID []  Parks Neptune, Pharm.D., BCPS []  Alycia Rossetti, Pharm.D., BCPS []  [place], [place].D., BCPS, AAHIVP []  Legrand Como, Pharm.D., BCPS, AAHIVP [x]  Salome Arnt, PharmD, BCPS []  Johnnette Gourd, PharmD, BCPS []  Hughes Better, PharmD, BCPS []  Leeroy Cha, PharmD  Positive urine culture Treated with cephalexin, organism sensitive to the same and no further patient follow-up is required at this time.  Harlon Flor [facility] 12/12/2017, 11:14 AM

## 2020-08-25 ENCOUNTER — Encounter (HOSPITAL_BASED_OUTPATIENT_CLINIC_OR_DEPARTMENT_OTHER): Payer: Self-pay | Admitting: *Deleted

## 2020-08-25 ENCOUNTER — Emergency Department (HOSPITAL_BASED_OUTPATIENT_CLINIC_OR_DEPARTMENT_OTHER)
Admission: EM | Admit: 2020-08-25 | Discharge: 2020-08-25 | Disposition: A | Discharge status: Home / Self Care | Payer: Medicare Other | Attending: Emergency Medicine | Admitting: Emergency Medicine

## 2020-08-25 ENCOUNTER — Other Ambulatory Visit: Payer: Self-pay

## 2020-08-25 ENCOUNTER — Emergency Department (HOSPITAL_BASED_OUTPATIENT_CLINIC_OR_DEPARTMENT_OTHER): Payer: Medicare Other

## 2020-08-25 DIAGNOSIS — R0602 Shortness of breath: Secondary | ICD-10-CM | POA: Diagnosis present

## 2020-08-25 DIAGNOSIS — Z20822 Contact with and (suspected) exposure to covid-19: Secondary | ICD-10-CM | POA: Diagnosis not present

## 2020-08-25 DIAGNOSIS — Z859 Personal history of malignant neoplasm, unspecified: Secondary | ICD-10-CM | POA: Insufficient documentation

## 2020-08-25 DIAGNOSIS — Z7902 Long term (current) use of antithrombotics/antiplatelets: Secondary | ICD-10-CM | POA: Diagnosis not present

## 2020-08-25 DIAGNOSIS — I10 Essential (primary) hypertension: Secondary | ICD-10-CM | POA: Diagnosis not present

## 2020-08-25 DIAGNOSIS — I2510 Atherosclerotic heart disease of native coronary artery without angina pectoris: Secondary | ICD-10-CM | POA: Diagnosis not present

## 2020-08-25 DIAGNOSIS — Z79899 Other long term (current) drug therapy: Secondary | ICD-10-CM | POA: Diagnosis not present

## 2020-08-25 DIAGNOSIS — J4541 Moderate persistent asthma with (acute) exacerbation: Secondary | ICD-10-CM | POA: Diagnosis not present

## 2020-08-25 LAB — COMPREHENSIVE METABOLIC PANEL
ALT: 25 U/L (ref 0–44)
AST: 30 U/L (ref 15–41)
Albumin: 4.4 g/dL (ref 3.5–5.0)
Alkaline Phosphatase: 103 U/L (ref 38–126)
Anion gap: 9 (ref 5–15)
BUN: 15 mg/dL (ref 8–23)
CO2: 28 mmol/L (ref 22–32)
Calcium: 9.6 mg/dL (ref 8.9–10.3)
Chloride: 102 mmol/L (ref 98–111)
Creatinine, Ser: 1.25 mg/dL — ABNORMAL HIGH (ref 0.44–1.00)
GFR, Estimated: 40 mL/min — ABNORMAL LOW (ref >60)
Glucose, Bld: 110 mg/dL — ABNORMAL HIGH (ref 70–99)
Potassium: 3.6 mmol/L (ref 3.5–5.1)
Sodium: 139 mmol/L (ref 135–145)
Total Bilirubin: 0.8 mg/dL (ref 0.3–1.2)
Total Protein: 8.3 g/dL — ABNORMAL HIGH (ref 6.5–8.1)

## 2020-08-25 LAB — CBC WITH DIFFERENTIAL/PLATELET
Abs Immature Granulocytes: 0.02 10*3/uL (ref 0.00–0.07)
Basophils Absolute: 0 10*3/uL (ref 0.0–0.1)
Basophils Relative: 0 %
Eosinophils Absolute: 0.5 10*3/uL (ref 0.0–0.5)
Eosinophils Relative: 5 %
HCT: 42.5 % (ref 36.0–46.0)
Hemoglobin: 14 g/dL (ref 12.0–15.0)
Immature Granulocytes: 0 %
Lymphocytes Relative: 45 %
Lymphs Abs: 4.2 10*3/uL — ABNORMAL HIGH (ref 0.7–4.0)
MCH: 30.6 pg (ref 26.0–34.0)
MCHC: 32.9 g/dL (ref 30.0–36.0)
MCV: 93 fL (ref 80.0–100.0)
Monocytes Absolute: 0.6 10*3/uL (ref 0.1–1.0)
Monocytes Relative: 6 %
Neutro Abs: 4.2 10*3/uL (ref 1.7–7.7)
Neutrophils Relative %: 44 %
Platelets: 272 10*3/uL (ref 150–400)
RBC: 4.57 MIL/uL (ref 3.87–5.11)
RDW: 13.2 % (ref 11.5–15.5)
WBC: 9.5 10*3/uL (ref 4.0–10.5)
nRBC: 0 % (ref 0.0–0.2)

## 2020-08-25 LAB — RESP PANEL BY RT-PCR (FLU A&B, COVID) ARPGX2
Influenza A by PCR: NEGATIVE
Influenza B by PCR: NEGATIVE
SARS Coronavirus 2 by RT PCR: NEGATIVE

## 2020-08-25 LAB — BRAIN NATRIURETIC PEPTIDE: B Natriuretic Peptide: 123.2 pg/mL — ABNORMAL HIGH (ref 0.0–100.0)

## 2020-08-25 LAB — TROPONIN I (HIGH SENSITIVITY)
Troponin I (High Sensitivity): 11 ng/L (ref <18)
Troponin I (High Sensitivity): 11 ng/L (ref <18)

## 2020-08-25 MED ORDER — IPRATROPIUM-ALBUTEROL 0.5-2.5 (3) MG/3ML IN SOLN
3.0000 mL | Freq: Once | RESPIRATORY_TRACT | Status: DC
  Filled 2020-08-25: qty 3

## 2020-08-25 MED ORDER — DEXAMETHASONE SODIUM PHOSPHATE 10 MG/ML IJ SOLN
10.0000 mg | Freq: Once | INTRAMUSCULAR | Status: AC
  Administered 2020-08-25: 10 mg via INTRAVENOUS
  Filled 2020-08-25: qty 1

## 2020-08-25 MED ORDER — IPRATROPIUM BROMIDE HFA 17 MCG/ACT IN AERS
4.0000 | INHALATION_SPRAY | Freq: Once | RESPIRATORY_TRACT | Status: AC
  Administered 2020-08-25: 4 via RESPIRATORY_TRACT
  Filled 2020-08-25: qty 12.9

## 2020-08-25 MED ORDER — ALBUTEROL SULFATE HFA 108 (90 BASE) MCG/ACT IN AERS
8.0000 | INHALATION_SPRAY | Freq: Once | RESPIRATORY_TRACT | Status: AC
  Administered 2020-08-25: 8 via RESPIRATORY_TRACT
  Filled 2020-08-25: qty 6.7

## 2020-08-25 MED ORDER — DOXYCYCLINE HYCLATE 100 MG PO CAPS: 100.0000 mg | ORAL_CAPSULE | Freq: Two times a day (BID) | ORAL | 0 refills | Status: DC

## 2020-08-25 MED ORDER — PREDNISONE 20 MG PO TABS: 60.0000 mg | ORAL_TABLET | Freq: Every day | ORAL | 0 refills | Status: AC

## 2020-08-25 MED ORDER — DOXYCYCLINE HYCLATE 100 MG PO TABS
100.0000 mg | ORAL_TABLET | Freq: Once | ORAL | Status: AC
  Administered 2020-08-25: 100 mg via ORAL
  Filled 2020-08-25: qty 1

## 2020-08-25 NOTE — ED Provider Notes (Signed)
[facility] EMERGENCY DEPARTMENT Provider Note   CSN: [id number] Arrival date & time: 08/25/20  1728     History Chief Complaint  Patient presents with   Asthma    Wendy Lucas is a 87 y.o. female.  Has not had any recent courses of steroids. Has been taking cephalexin for a UTI.  The history is provided by the patient and a relative (daughter).  Shortness of Breath Severity:  Moderate Onset quality:  Sudden Duration: 3 weeks, worse since 3 am today. Chronicity:  Recurrent Context comment:  History of asthma. Does not use home O2. Relieved by:  Nothing Worsened by:  Activity Ineffective treatments:  Inhaler Associated symptoms: chest pain (left sided radiating to left arm), cough, sputum production (brown) and wheezing   Associated symptoms: no abdominal pain, no ear pain, no fever, no hemoptysis, no rash, no sore throat and no vomiting       Past Medical History:  Diagnosis Date   Arthritis    Cancer ([place])    Coronary artery disease    GERD (gastroesophageal reflux disease)    H/O: hysterectomy    Hyperlipidemia    Hypertension    Stroke [facility])     Patient Active Problem List   Diagnosis Date Noted   Coronary artery disease 12/13/2010   Hypertension 12/13/2010   Chest pain 12/13/2010   Hyperlipidemia 12/13/2010    Past Surgical History:  Procedure Laterality Date   ABDOMINAL HYSTERECTOMY     APPENDECTOMY     CHOLECYSTECTOMY     multiple surgeries r/t cancer in legs     TONSILLECTOMY       OB History   No obstetric history on file.     History reviewed. No pertinent family history.  Social History   Tobacco Use   Smoking status: Never   Smokeless tobacco: Never  Substance Use Topics   Alcohol use: No    Home Medications Prior to Admission medications   Medication Sig Start Date End Date Taking? Authorizing Provider  acetaminophen (TYLENOL) 500 MG tablet Take 500 mg by mouth 2 (two) times daily.     [provider]   albuterol (PROVENTIL HFA;VENTOLIN HFA) 108 (90 Base) MCG/ACT inhaler INHALE 2 PUFFS INTO THE LUNGS EVERY 4 HOURS AS NEEDED FOR WHEEZING 08/07/17   [provider]  amLODipine (NORVASC) 10 MG tablet Take 10 mg by mouth daily.      [provider]  atenolol (TENORMIN) 50 MG tablet Take 75 mg by mouth 2 (two) times daily.     [provider]  atorvastatin (LIPITOR) 40 MG tablet TAKE 1 TABLET BY MOUTH  EVERY DAY 08/28/17   [provider]  budesonide (PULMICORT) 0.5 MG/2ML nebulizer solution Take 2 mLs (0.5 mg total) by nebulization 2 times daily. 08/07/17   [provider]  cephALEXin (KEFLEX) 500 MG capsule Take 1 capsule (500 mg total) by mouth 3 (three) times daily. 04/19/13   Pattricia Boss, MD  cephALEXin (KEFLEX) 500 MG capsule Take 2 capsules (1,000 mg total) by mouth 2 (two) times daily. 12/08/17   Charlesetta Shanks, MD  clobetasol (TEMOVATE) 0.05 % ointment Apply topically 2 (two) times daily.      [provider]  clopidogrel (PLAVIX) 75 MG tablet Take 75 mg by mouth daily.      [provider]  furosemide (LASIX) 20 MG tablet TAKE 1 TABLET BY MOUTH  DAILY AS NEEDED FOR  SWELLING 06/17/17   [provider]  Multiple Vitamin (  MULTIVITAMIN) tablet Take 1 tablet by mouth daily.      [provider]  pantoprazole (PROTONIX) 40 MG tablet Take 40 mg by mouth daily.      [provider]  pantoprazole (PROTONIX) 40 MG tablet TAKE 1 TABLET BY MOUTH TWICE DAILY 09/06/17   [provider]  Pitavastatin Calcium (LIVALO) 4 MG TABS Take 1 tablet by mouth daily.      [provider]  potassium chloride (KLOR-CON) 10 MEQ CR tablet Take 10 mEq by mouth 2 (two) times daily.      [provider]  potassium chloride SA (K-DUR,KLOR-CON) 20 MEQ tablet Take 1 tablet (20 mEq total) by mouth daily. 12/08/17   Charlesetta Shanks, MD  pravastatin (PRAVACHOL) 80 MG tablet Take 80 mg by mouth at bedtime.    [provider]  predniSONE (DELTASONE) 20 MG tablet 2 tabs po daily x 4 days 09/28/17   Malvin Johns, MD    Allergies    Iohexol, Shellfish allergy, Aspirin, Codeine, Sulfa antibiotics, and Tramadol  Review of Systems   Review of Systems  Constitutional:  Negative for chills and fever.  HENT:  Negative for ear pain and sore throat.   Eyes:  Negative for pain and visual disturbance.  Respiratory:  Positive for cough, sputum production (brown), shortness of breath and wheezing. Negative for hemoptysis.   Cardiovascular:  Positive for chest pain (left sided radiating to left arm). Negative for palpitations.  Gastrointestinal:  Negative for abdominal pain and vomiting.  Genitourinary:  Negative for dysuria and hematuria.  Musculoskeletal:  Negative for arthralgias and back pain.  Skin:  Negative for color change and rash.  Neurological:  Negative for seizures and syncope.  All other systems reviewed and are negative.  Physical Exam Updated Vital Signs BP (!) 161/89 (BP Location: Left Arm)   Pulse 92   Temp 98.3 F (36.8 C) (Oral)   Resp (!) 22   Ht 5\' 2"  (1.575 m)   Wt 74.8 kg   SpO2 95%   BMI 30.18 kg/m   Physical Exam Vitals and nursing note reviewed.  Constitutional:      General: She is not in acute distress.    Appearance: She is well-developed.  HENT:     Head: Normocephalic and atraumatic.  Eyes:     Conjunctiva/sclera: Conjunctivae normal.  Cardiovascular:     Rate and Rhythm: Normal rate and regular rhythm.     Heart sounds: No murmur heard. Pulmonary:     Effort: Tachypnea present. No respiratory distress.     Breath sounds: Examination of the right-upper field reveals wheezing. Examination of the left-upper field reveals wheezing. Examination of the right-middle field reveals wheezing. Examination of the left-middle field reveals wheezing. Examination of the right-lower field reveals wheezing. Examination of the left-lower field reveals wheezing. Wheezing  present.  Abdominal:     Palpations: Abdomen is soft.     Tenderness: There is no abdominal tenderness.  Musculoskeletal:     Cervical back: Neck supple.  Skin:    General: Skin is warm and dry.  Neurological:     General: No focal deficit present.     Mental Status: She is alert and oriented to person, place, and time.    ED Results / Procedures / Treatments   Labs (all labs ordered are listed, but only abnormal results are displayed) Labs Reviewed  CBC WITH DIFFERENTIAL/PLATELET - Abnormal; Notable for the following components:      Result Value   Lymphs  Abs 4.2 (*)    All other components within normal limits  COMPREHENSIVE METABOLIC PANEL - Abnormal; Notable for the following components:   Glucose, Bld 110 (*)    Creatinine, Ser 1.25 (*)    Total Protein 8.3 (*)    GFR, Estimated 40 (*)    All other components within normal limits  BRAIN NATRIURETIC PEPTIDE - Abnormal; Notable for the following components:   B Natriuretic Peptide 123.2 (*)    All other components within normal limits  RESP PANEL BY RT-PCR (FLU A&B, COVID) ARPGX2  TROPONIN I (HIGH SENSITIVITY)  TROPONIN I (HIGH SENSITIVITY)    EKG EKG Interpretation  Date/Time:  Thursday August 25 2020 17:43:00 EDT Ventricular Rate:  96 PR Interval:  202 QRS Duration: 86 QT Interval:  360 QTC Calculation: 455 R Axis:   61 Text Interpretation: Sinus rhythm Ventricular premature complex Aberrant conduction of SV complex(es) normal axis No acute ischemia Confirmed by Lorre Munroe ([id number]) on 08/25/2020 6:02:21 PM  Radiology US Venous Img Lower Bilateral  Result Date: 08/25/2020 CLINICAL DATA:  Bilateral lower extremity swelling. EXAM: BILATERAL LOWER EXTREMITY VENOUS DOPPLER ULTRASOUND TECHNIQUE: Gray-scale sonography with graded compression, as well as color Doppler and duplex ultrasound were performed to evaluate the lower extremity deep venous systems from the level of the common femoral vein and including the common  femoral, femoral, profunda femoral, popliteal and calf veins including the posterior tibial, peroneal and gastrocnemius veins when visible. The superficial great saphenous vein was also interrogated. Spectral Doppler was utilized to evaluate flow at rest and with distal augmentation maneuvers in the common femoral, femoral and popliteal veins. COMPARISON:  None. FINDINGS: RIGHT LOWER EXTREMITY Common Femoral Vein: No evidence of thrombus. Normal compressibility, respiratory phasicity and response to augmentation. Saphenofemoral Junction: No evidence of thrombus. Normal compressibility and flow on color Doppler imaging. Profunda Femoral Vein: No evidence of thrombus. Normal compressibility and flow on color Doppler imaging. Femoral Vein: No evidence of thrombus. Normal compressibility, respiratory phasicity and response to augmentation. Popliteal Vein: No evidence of thrombus. Normal compressibility, respiratory phasicity and response to augmentation. Calf Veins: No evidence of thrombus. Normal compressibility and flow on color Doppler imaging. Superficial Great Saphenous Vein: No evidence of thrombus. Normal compressibility. Venous Reflux:  None. Other Findings: A 2.9 cm x 2.9 cm hypoechoic area is seen within the soft tissues of the right popliteal fossa. No abnormal flow is seen within this region on color Doppler evaluation. LEFT LOWER EXTREMITY Common Femoral Vein: No evidence of thrombus. Normal compressibility, respiratory phasicity and response to augmentation. Saphenofemoral Junction: No evidence of thrombus. Normal compressibility and flow on color Doppler imaging. Profunda Femoral Vein: No evidence of thrombus. Normal compressibility and flow on color Doppler imaging. Femoral Vein: No evidence of thrombus. Normal compressibility, respiratory phasicity and response to augmentation. Popliteal Vein: No evidence of thrombus. Normal compressibility, respiratory phasicity and response to augmentation. Calf  Veins: No evidence of thrombus. Normal compressibility and flow on color Doppler imaging. Superficial Great Saphenous Vein: No evidence of thrombus. Normal compressibility. Venous Reflux:  None. Other Findings:  None. IMPRESSION: 1. No evidence of deep venous thrombosis in either lower extremity. 2. Right Baker's cyst. Electronically Signed   By: Virgina Norfolk M.D.   On: 08/25/2020 19:58   DG Chest Port 1 View  Result Date: 08/25/2020 CLINICAL DATA:  Shortness of breath EXAM: PORTABLE CHEST 1 VIEW COMPARISON:  July 17, 2018 FINDINGS: The heart size and mediastinal contours are stable. Both lungs are clear. The visualized skeletal  structures are stable. IMPRESSION: No active disease. Electronically Signed   By: Abelardo Diesel M.D.   On: 08/25/2020 18:32    Procedures Procedures   Medications Ordered in ED Medications  ipratropium-albuterol (DUONEB) 0.5-2.5 (3) MG/3ML nebulizer solution 3 mL (has no administration in time range)  doxycycline (VIBRA-TABS) tablet 100 mg (has no administration in time range)  dexamethasone (DECADRON) injection 10 mg (10 mg Intravenous Given 08/25/20 1812)  albuterol (VENTOLIN HFA) 108 (90 Base) MCG/ACT inhaler 8 puff (8 puffs Inhalation Given 08/25/20 1802)  ipratropium (ATROVENT HFA) inhaler 4 puff (4 puffs Inhalation Given 08/25/20 1802)    ED Course  I have reviewed the triage vital signs and the nursing notes.  Pertinent labs & imaging results that were available during my care of the patient were reviewed by me and considered in my medical decision making (see chart for details).    MDM Rules/Calculators/A&P                          Cheryl Maurine Cane presented with 3 weeks of asthma symptoms that have been resistant to [facility] and her albuterol inhaler.  ED work-up was undertaken to evaluate for other sources of her wheezing such as pneumonia, acute coronary syndrome, CHF.  She has an IV dye allergy, and I knew it was going to be difficult to evaluate  her for pulmonary embolism.  I think this is less likely than other pathologies.  However, I did obtain DVT studies of her lower legs which were negative.  Ultimately, she improved during her ED stay.  I spoke with her and her daughter about whether or not to come in for observation admission or to try outpatient management.  The patient is well supported by multiple family members, and she and her daughter felt comfortable going home.  I have written a prescription for prednisone and doxycycline.  She was given very careful return precautions. Final Clinical Impression(s) / ED Diagnoses Final diagnoses:  Moderate persistent asthma with exacerbation    Rx / DC Orders ED Discharge Orders          Ordered    doxycycline (VIBRAMYCIN) 100 MG capsule  2 times daily        08/25/20 2158    predniSONE (DELTASONE) 20 MG tablet  Daily        08/25/20 2200             Arnaldo Natal, MD 08/25/20 2204

## 2020-08-25 NOTE — ED Triage Notes (Signed)
Asthma flare up since 3am this morning. Denies CP. Audible wheezing noted.

## 2020-08-25 NOTE — ED Notes (Signed)
Pt continues to have bilateral insp & exp wheezing after inhaler administration.  Not as coarse.

## 2020-10-14 ENCOUNTER — Encounter (HOSPITAL_BASED_OUTPATIENT_CLINIC_OR_DEPARTMENT_OTHER): Payer: Self-pay | Admitting: Urology

## 2020-10-14 ENCOUNTER — Emergency Department (HOSPITAL_BASED_OUTPATIENT_CLINIC_OR_DEPARTMENT_OTHER)
Admission: EM | Admit: 2020-10-14 | Discharge: 2020-10-14 | Disposition: A | Discharge status: Home / Self Care | Payer: Medicare Other | Attending: Emergency Medicine | Admitting: Emergency Medicine

## 2020-10-14 ENCOUNTER — Emergency Department (HOSPITAL_BASED_OUTPATIENT_CLINIC_OR_DEPARTMENT_OTHER): Payer: Medicare Other

## 2020-10-14 ENCOUNTER — Other Ambulatory Visit (HOSPITAL_BASED_OUTPATIENT_CLINIC_OR_DEPARTMENT_OTHER): Payer: Self-pay

## 2020-10-14 ENCOUNTER — Other Ambulatory Visit: Payer: Self-pay

## 2020-10-14 DIAGNOSIS — Z79899 Other long term (current) drug therapy: Secondary | ICD-10-CM | POA: Diagnosis not present

## 2020-10-14 DIAGNOSIS — I2510 Atherosclerotic heart disease of native coronary artery without angina pectoris: Secondary | ICD-10-CM | POA: Diagnosis not present

## 2020-10-14 DIAGNOSIS — Z7951 Long term (current) use of inhaled steroids: Secondary | ICD-10-CM | POA: Diagnosis not present

## 2020-10-14 DIAGNOSIS — J45901 Unspecified asthma with (acute) exacerbation: Secondary | ICD-10-CM

## 2020-10-14 DIAGNOSIS — J4521 Mild intermittent asthma with (acute) exacerbation: Secondary | ICD-10-CM | POA: Diagnosis not present

## 2020-10-14 DIAGNOSIS — Z7902 Long term (current) use of antithrombotics/antiplatelets: Secondary | ICD-10-CM | POA: Insufficient documentation

## 2020-10-14 DIAGNOSIS — I10 Essential (primary) hypertension: Secondary | ICD-10-CM | POA: Diagnosis not present

## 2020-10-14 DIAGNOSIS — Z859 Personal history of malignant neoplasm, unspecified: Secondary | ICD-10-CM | POA: Diagnosis not present

## 2020-10-14 DIAGNOSIS — Z20822 Contact with and (suspected) exposure to covid-19: Secondary | ICD-10-CM | POA: Diagnosis not present

## 2020-10-14 DIAGNOSIS — R0682 Tachypnea, not elsewhere classified: Secondary | ICD-10-CM | POA: Insufficient documentation

## 2020-10-14 DIAGNOSIS — R0602 Shortness of breath: Secondary | ICD-10-CM | POA: Diagnosis present

## 2020-10-14 LAB — HEPATIC FUNCTION PANEL
ALT: 23 U/L (ref 0–44)
AST: 31 U/L (ref 15–41)
Albumin: 4.6 g/dL (ref 3.5–5.0)
Alkaline Phosphatase: 113 U/L (ref 38–126)
Bilirubin, Direct: 0.2 mg/dL (ref 0.0–0.2)
Indirect Bilirubin: 0.7 mg/dL (ref 0.3–0.9)
Total Bilirubin: 0.9 mg/dL (ref 0.3–1.2)
Total Protein: 7.9 g/dL (ref 6.5–8.1)

## 2020-10-14 LAB — CBC WITH DIFFERENTIAL/PLATELET
Abs Immature Granulocytes: 0.02 10*3/uL (ref 0.00–0.07)
Basophils Absolute: 0.1 10*3/uL (ref 0.0–0.1)
Basophils Relative: 1 %
Eosinophils Absolute: 0.7 10*3/uL — ABNORMAL HIGH (ref 0.0–0.5)
Eosinophils Relative: 8 %
HCT: 42.8 % (ref 36.0–46.0)
Hemoglobin: 14.3 g/dL (ref 12.0–15.0)
Immature Granulocytes: 0 %
Lymphocytes Relative: 41 %
Lymphs Abs: 3.9 10*3/uL (ref 0.7–4.0)
MCH: 30.8 pg (ref 26.0–34.0)
MCHC: 33.4 g/dL (ref 30.0–36.0)
MCV: 92.2 fL (ref 80.0–100.0)
Monocytes Absolute: 0.8 10*3/uL (ref 0.1–1.0)
Monocytes Relative: 8 %
Neutro Abs: 3.9 10*3/uL (ref 1.7–7.7)
Neutrophils Relative %: 42 %
Platelets: 176 10*3/uL (ref 150–400)
RBC: 4.64 MIL/uL (ref 3.87–5.11)
RDW: 13.1 % (ref 11.5–15.5)
WBC: 9.4 10*3/uL (ref 4.0–10.5)
nRBC: 0 % (ref 0.0–0.2)

## 2020-10-14 LAB — BASIC METABOLIC PANEL
Anion gap: 11 (ref 5–15)
BUN: 13 mg/dL (ref 8–23)
CO2: 24 mmol/L (ref 22–32)
Calcium: 9.3 mg/dL (ref 8.9–10.3)
Chloride: 104 mmol/L (ref 98–111)
Creatinine, Ser: 1.17 mg/dL — ABNORMAL HIGH (ref 0.44–1.00)
GFR, Estimated: 43 mL/min — ABNORMAL LOW (ref >60)
Glucose, Bld: 85 mg/dL (ref 70–99)
Potassium: 3.8 mmol/L (ref 3.5–5.1)
Sodium: 139 mmol/L (ref 135–145)

## 2020-10-14 LAB — RESP PANEL BY RT-PCR (FLU A&B, COVID) ARPGX2
Influenza A by PCR: NEGATIVE
Influenza B by PCR: NEGATIVE
SARS Coronavirus 2 by RT PCR: NEGATIVE

## 2020-10-14 LAB — BRAIN NATRIURETIC PEPTIDE: B Natriuretic Peptide: 136.2 pg/mL — ABNORMAL HIGH (ref 0.0–100.0)

## 2020-10-14 LAB — TROPONIN I (HIGH SENSITIVITY): Troponin I (High Sensitivity): 9 ng/L (ref <18)

## 2020-10-14 MED ORDER — ALBUTEROL SULFATE (2.5 MG/3ML) 0.083% IN NEBU: 2.5000 mg | INHALATION_SOLUTION | Freq: Once | RESPIRATORY_TRACT | Status: AC

## 2020-10-14 MED ORDER — IPRATROPIUM-ALBUTEROL 0.5-2.5 (3) MG/3ML IN SOLN
3.0000 mL | Freq: Once | RESPIRATORY_TRACT | Status: AC
  Administered 2020-10-14: 3 mL via RESPIRATORY_TRACT
  Filled 2020-10-14: qty 3

## 2020-10-14 MED ORDER — PREDNISONE 20 MG PO TABS
60.0000 mg | ORAL_TABLET | Freq: Every day | ORAL | 0 refills | Status: AC
  Filled 2020-10-14: qty 12, 4d supply, fill #0

## 2020-10-14 MED ORDER — IPRATROPIUM-ALBUTEROL 0.5-2.5 (3) MG/3ML IN SOLN
RESPIRATORY_TRACT | Status: AC
  Administered 2020-10-14: 3 mL via RESPIRATORY_TRACT
  Filled 2020-10-14: qty 3

## 2020-10-14 MED ORDER — ALBUTEROL SULFATE (2.5 MG/3ML) 0.083% IN NEBU
INHALATION_SOLUTION | RESPIRATORY_TRACT | Status: AC
  Administered 2020-10-14: 2.5 mg via RESPIRATORY_TRACT
  Filled 2020-10-14: qty 3

## 2020-10-14 MED ORDER — METHYLPREDNISOLONE SODIUM SUCC 125 MG IJ SOLR
125.0000 mg | Freq: Once | INTRAMUSCULAR | Status: AC
  Administered 2020-10-14: 125 mg via INTRAVENOUS
  Filled 2020-10-14: qty 2

## 2020-10-14 MED ORDER — IPRATROPIUM-ALBUTEROL 0.5-2.5 (3) MG/3ML IN SOLN: 3.0000 mL | Freq: Once | RESPIRATORY_TRACT | Status: AC

## 2020-10-14 NOTE — ED Triage Notes (Signed)
Pt c/o wet/productive cough, Shortness of breath x 2 days, h/o asthma.  Sat on RA 90%, RR 36.  Pt in acute distress, resp at bedside

## 2020-10-14 NOTE — ED Notes (Signed)
Assisted patient to bedside commode

## 2020-10-14 NOTE — Discharge Instructions (Addendum)
Use inhaler every 4 hours for the next 24 hours and then as needed.  Take your next dose of steroid tomorrow

## 2020-10-14 NOTE — ED Provider Notes (Signed)
[facility] EMERGENCY DEPARTMENT Provider Note   CSN: [id number] Arrival date & time: 10/14/20  1307     History Chief Complaint  Patient presents with   Shortness of Breath   Cough    Wendy Lucas is a 87 y.o. female.  The history is provided by the patient and a caregiver.  Shortness of Breath Severity:  Mild Timing:  Intermittent Progression:  Waxing and waning Chronicity:  Recurrent Context comment:  Asthma symptoms Relieved by:  Nothing Worsened by:  Nothing Associated symptoms: cough and wheezing   Associated symptoms: no abdominal pain, no chest pain, no ear pain, no fever, no rash, no sore throat and no vomiting   Cough Associated symptoms: shortness of breath and wheezing   Associated symptoms: no chest pain, no chills, no ear pain, no fever, no rash and no sore throat       Past Medical History:  Diagnosis Date   Arthritis    Cancer ([place])    Coronary artery disease    GERD (gastroesophageal reflux disease)    H/O: hysterectomy    Hyperlipidemia    Hypertension    Stroke [facility])     Patient Active Problem List   Diagnosis Date Noted   Coronary artery disease 12/13/2010   Hypertension 12/13/2010   Chest pain 12/13/2010   Hyperlipidemia 12/13/2010    Past Surgical History:  Procedure Laterality Date   ABDOMINAL HYSTERECTOMY     APPENDECTOMY     CHOLECYSTECTOMY     multiple surgeries r/t cancer in legs     TONSILLECTOMY       OB History   No obstetric history on file.     History reviewed. No pertinent family history.  Social History   Tobacco Use   Smoking status: Never   Smokeless tobacco: Never  Substance Use Topics   Alcohol use: No    Home Medications Prior to Admission medications   Medication Sig Start Date End Date Taking? Authorizing Provider  predniSONE (DELTASONE) 20 MG tablet Take 3 tablets (60 mg total) by mouth daily for 4 days. 10/14/20 10/18/20 Yes Daanish Copes, DO  acetaminophen (TYLENOL) 500 MG  tablet Take 500 mg by mouth 2 (two) times daily.     [provider]  albuterol (PROVENTIL HFA;VENTOLIN HFA) 108 (90 Base) MCG/ACT inhaler INHALE 2 PUFFS INTO THE LUNGS EVERY 4 HOURS AS NEEDED FOR WHEEZING 08/07/17   [provider]  amLODipine (NORVASC) 10 MG tablet Take 10 mg by mouth daily.      [provider]  atenolol (TENORMIN) 50 MG tablet Take 75 mg by mouth 2 (two) times daily.     [provider]  atorvastatin (LIPITOR) 40 MG tablet TAKE 1 TABLET BY MOUTH  EVERY DAY 08/28/17   [provider]  budesonide (PULMICORT) 0.5 MG/2ML nebulizer solution Take 2 mLs (0.5 mg total) by nebulization 2 times daily. 08/07/17   [provider]  clobetasol (TEMOVATE) 0.05 % ointment Apply topically 2 (two) times daily.      [provider]  clopidogrel (PLAVIX) 75 MG tablet Take 75 mg by mouth daily.      [provider]  doxycycline (VIBRAMYCIN) 100 MG capsule Take 1 capsule (100 mg total) by mouth 2 (two) times daily. 08/25/20   Arnaldo Natal, MD  furosemide (LASIX) 20 MG tablet TAKE 1 TABLET BY MOUTH  DAILY AS NEEDED FOR  SWELLING 06/17/17   [provider]  Multiple Vitamin (MULTIVITAMIN) tablet Take 1 tablet  by mouth daily.      [provider]  pantoprazole (PROTONIX) 40 MG tablet Take 40 mg by mouth daily.      [provider]  pantoprazole (PROTONIX) 40 MG tablet TAKE 1 TABLET BY MOUTH TWICE DAILY 09/06/17   [provider]  Pitavastatin Calcium (LIVALO) 4 MG TABS Take 1 tablet by mouth daily.      [provider]  potassium chloride (KLOR-CON) 10 MEQ CR tablet Take 10 mEq by mouth 2 (two) times daily.      [provider]  potassium chloride SA (K-DUR,KLOR-CON) 20 MEQ tablet Take 1 tablet (20 mEq total) by mouth daily. 12/08/17   Charlesetta Shanks, MD  pravastatin (PRAVACHOL) 80 MG tablet Take 80 mg by mouth at bedtime.    [provider]    Allergies    Iohexol,  Shellfish allergy, Aspirin, Codeine, Sulfa antibiotics, and Tramadol  Review of Systems   Review of Systems  Constitutional:  Negative for chills and fever.  HENT:  Negative for ear pain and sore throat.   Eyes:  Negative for pain and visual disturbance.  Respiratory:  Positive for cough, shortness of breath and wheezing.   Cardiovascular:  Negative for chest pain and palpitations.  Gastrointestinal:  Negative for abdominal pain and vomiting.  Genitourinary:  Negative for dysuria and hematuria.  Musculoskeletal:  Negative for arthralgias and back pain.  Skin:  Negative for color change and rash.  Neurological:  Negative for seizures and syncope.  All other systems reviewed and are negative.  Physical Exam Updated Vital Signs BP (!) 147/77   Pulse 89   Temp 98.2 F (36.8 C) (Oral)   Resp (!) 24   Ht '5\' 2"'$  (1.575 m)   Wt 74.8 kg   SpO2 99%   BMI 30.16 kg/m   Physical Exam Vitals and nursing note reviewed.  Constitutional:      General: She is not in acute distress.    Appearance: She is well-developed. She is not ill-appearing.  HENT:     Head: Normocephalic and atraumatic.     Mouth/Throat:     Mouth: Mucous membranes are moist.  Eyes:     Extraocular Movements: Extraocular movements intact.     Conjunctiva/sclera: Conjunctivae normal.     Pupils: Pupils are equal, round, and reactive to light.  Cardiovascular:     Rate and Rhythm: Normal rate and regular rhythm.     Pulses: Normal pulses.     Heart sounds: Normal heart sounds. No murmur heard. Pulmonary:     Effort: Pulmonary effort is normal. Tachypnea present. No respiratory distress.     Breath sounds: Wheezing present.  Chest:     Chest wall: No deformity.  Abdominal:     Palpations: Abdomen is soft.     Tenderness: There is no abdominal tenderness.  Musculoskeletal:        General: Normal range of motion.     Cervical back: Neck supple.     Right lower leg: No edema.     Left lower leg: No edema.   Skin:    General: Skin is warm and dry.     Capillary Refill: Capillary refill takes less than 2 seconds.  Neurological:     General: No focal deficit present.     Mental Status: She is alert.  Psychiatric:        Mood and Affect: Mood normal.    ED Results / Procedures / Treatments   Labs (all labs ordered  are listed, but only abnormal results are displayed) Labs Reviewed  CBC WITH DIFFERENTIAL/PLATELET - Abnormal; Notable for the following components:      Result Value   Eosinophils Absolute 0.7 (*)    All other components within normal limits  BASIC METABOLIC PANEL - Abnormal; Notable for the following components:   Creatinine, Ser 1.17 (*)    GFR, Estimated 43 (*)    All other components within normal limits  BRAIN NATRIURETIC PEPTIDE - Abnormal; Notable for the following components:   B Natriuretic Peptide 136.2 (*)    All other components within normal limits  RESP PANEL BY RT-PCR (FLU A&B, COVID) ARPGX2  HEPATIC FUNCTION PANEL  TROPONIN I (HIGH SENSITIVITY)    EKG EKG Interpretation  Date/Time:  Friday October 14 2020 13:23:01 EDT Ventricular Rate:  98 PR Interval:  200 QRS Duration: 84 QT Interval:  357 QTC Calculation: 456 R Axis:   49 Text Interpretation: Sinus rhythm Confirmed by Lennice Sites ([id number]) on 10/14/2020 2:23:26 PM  Radiology DG Chest Portable 1 View  Result Date: 10/14/2020 CLINICAL DATA:  Shortness of breath, wet productive cough for 2 days. History of asthma by report. EXAM: PORTABLE CHEST 1 VIEW COMPARISON:  August 25, 2020. FINDINGS: EKG leads project over the chest. Cardiomediastinal contours and hilar structures are normal. Lungs are clear. No sign of effusion or visible pneumothorax on frontal radiograph. On limited assessment no acute skeletal process. IMPRESSION: No acute cardiopulmonary process. Electronically Signed   By: Zetta Bills M.D.   On: 10/14/2020 14:21    Procedures Procedures   Medications Ordered in ED Medications   ipratropium-albuterol (DUONEB) 0.5-2.5 (3) MG/3ML nebulizer solution 3 mL (3 mLs Nebulization Given 10/14/20 1330)  albuterol (PROVENTIL) (2.5 MG/3ML) 0.083% nebulizer solution 2.5 mg (2.5 mg Nebulization Given 10/14/20 1330)  methylPREDNISolone sodium succinate (SOLU-MEDROL) 125 mg/2 mL injection 125 mg (125 mg Intravenous Given 10/14/20 1400)    ED Course  I have reviewed the triage vital signs and the nursing notes.  Pertinent labs & imaging results that were available during my care of the patient were reviewed by me and considered in my medical decision making (see chart for details).    MDM Rules/Calculators/A&P                           Wendy Lucas is a 87 year old female with history of hypertension, stroke, CAD, asthma who presents the ED with shortness of breath, wheezing, cough.  Overall unremarkable vitals.  No fever.  Some mild wheezing on exam.  Overall appears well.  Suspect asthma exacerbation.  No chest pain.  Troponin normal, EKG shows sinus rhythm.  No significant anemia, electrolyte ab Mody, kidney injury.  COVID test negative.  Chest x-ray without any evidence of pneumonia or pneumothorax.  Overall work-up is unremarkable.  Suspect asthma exacerbation.  No concern for ACS or heart failure exacerbation or pneumonia.  Felt better after breathing treatment and IV steroids.  Recommend continued use of inhalers at home as well as steroids.  Understands return precautions and discharged from the ED in good condition.  This chart was dictated using voice recognition software.  Despite best efforts to proofread,  errors can occur which can change the documentation meaning.   Final Clinical Impression(s) / ED Diagnoses Final diagnoses:  Mild asthma with exacerbation, unspecified whether persistent    Rx / DC Orders ED Discharge Orders          Ordered  predniSONE (DELTASONE) 20 MG tablet  Daily        10/14/20 1503             Lennice Sites, DO 10/14/20  1509

## 2021-11-22 IMAGING — US US EXTREM LOW VENOUS
1 series · 13 of 24 positions shown · non-contrast
Comparison: None.

CLINICAL DATA: Bilateral lower extremity swelling.



[Series 1: us extrem low venous · 13 of 61 slices shown]
[im 1/61]
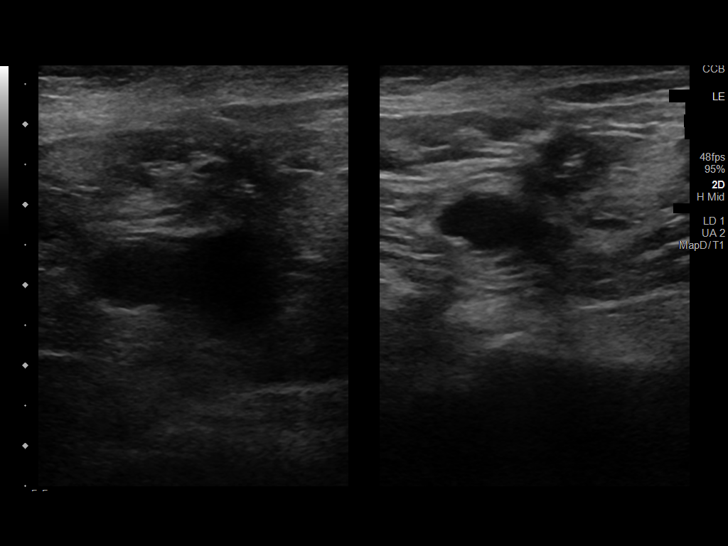
[im 6/61]
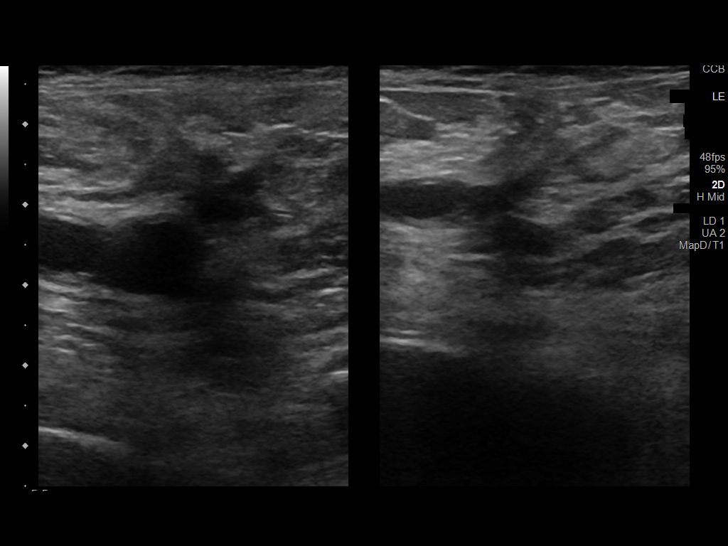
[im 11/61]
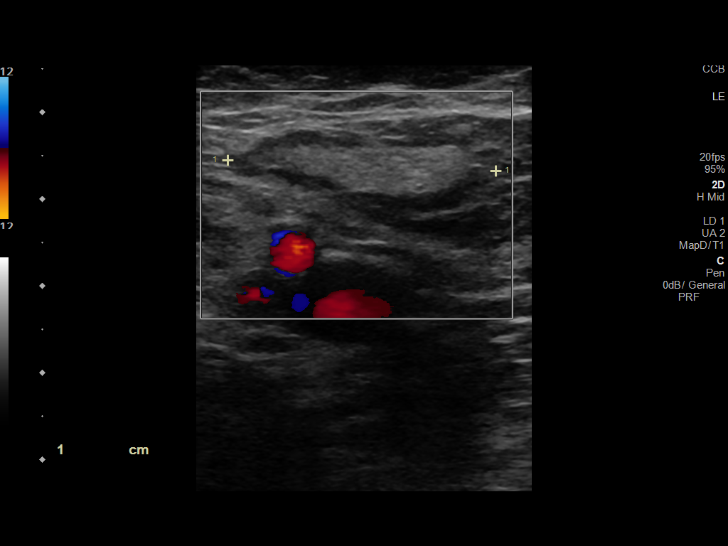
[im 16/61]
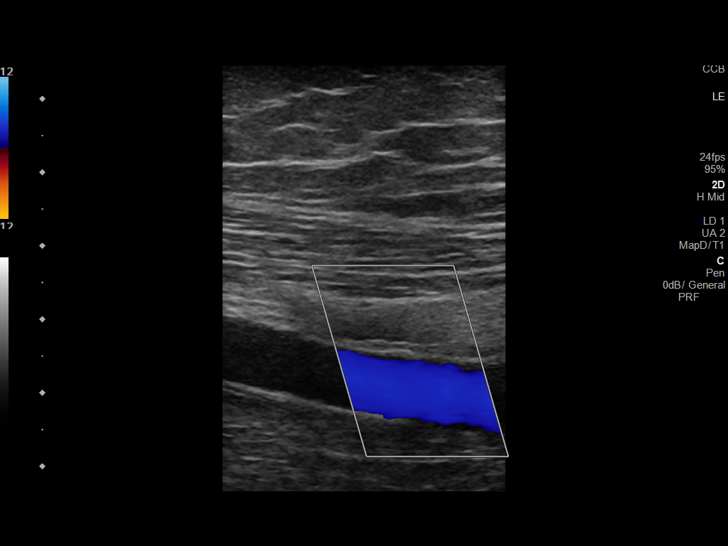
[im 21/61]
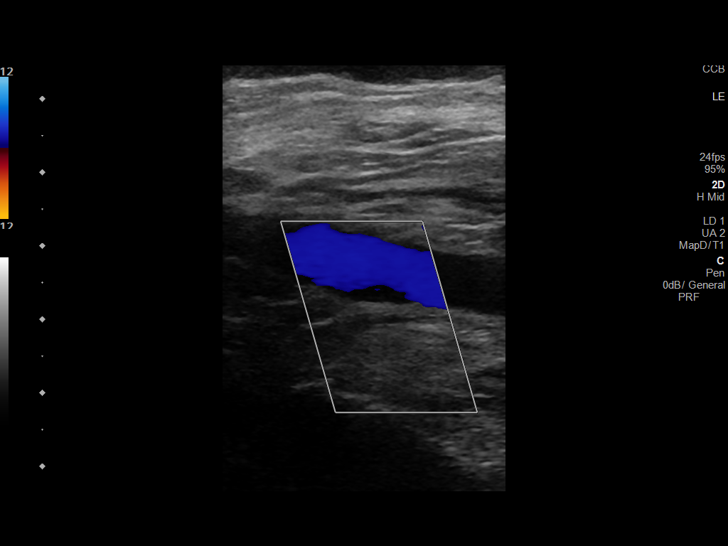
[im 27/61]
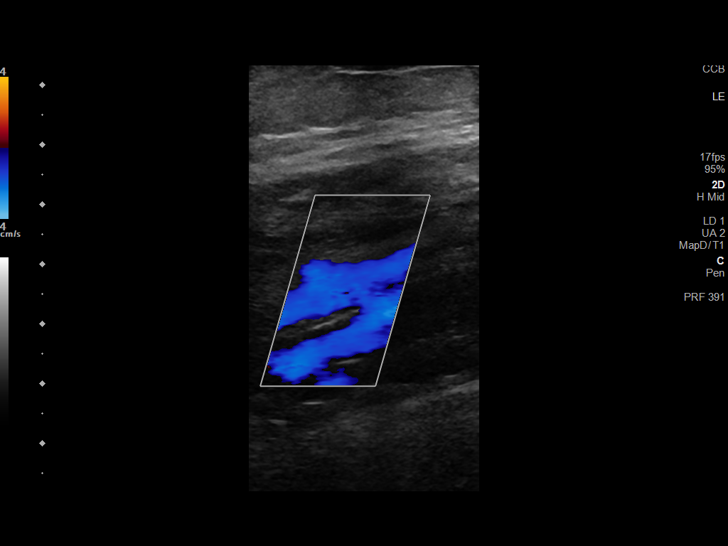
[im 32/61]
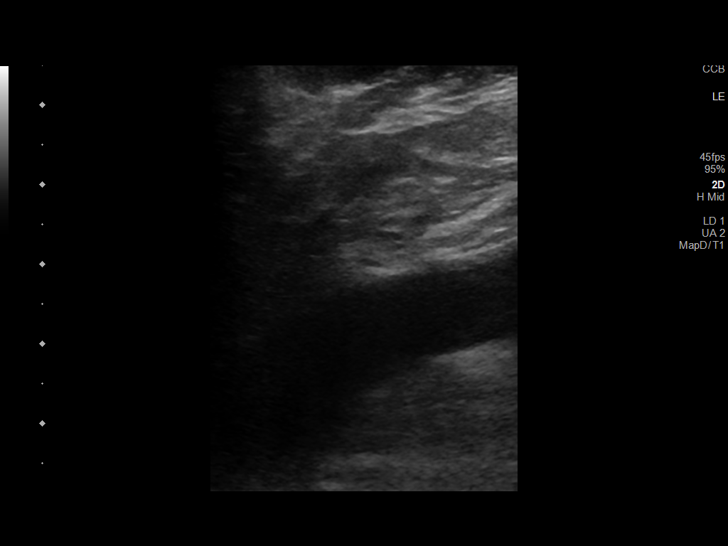
[im 34/61]
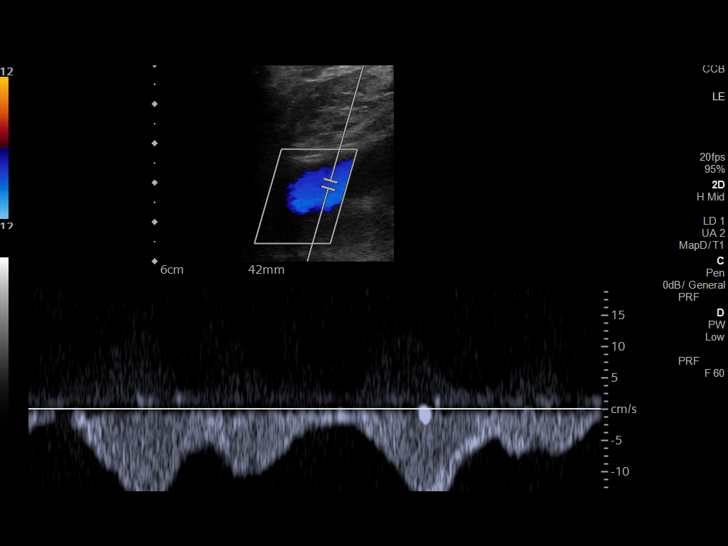
[im 40/61]
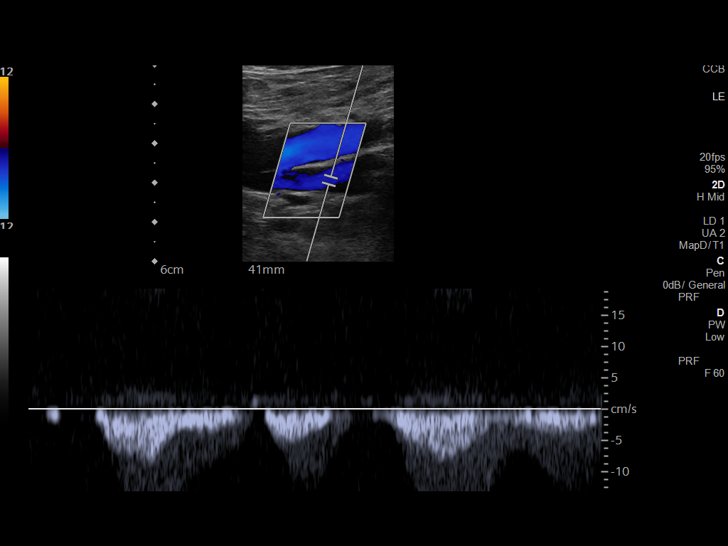
[im 45/61]
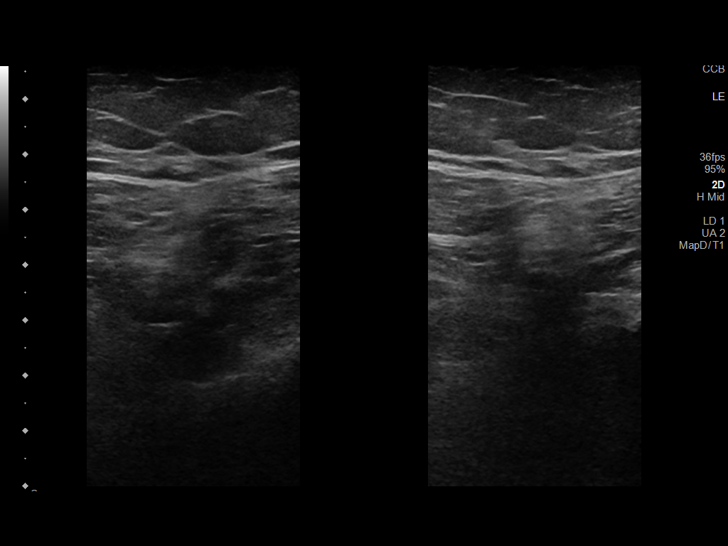
[im 50/61]
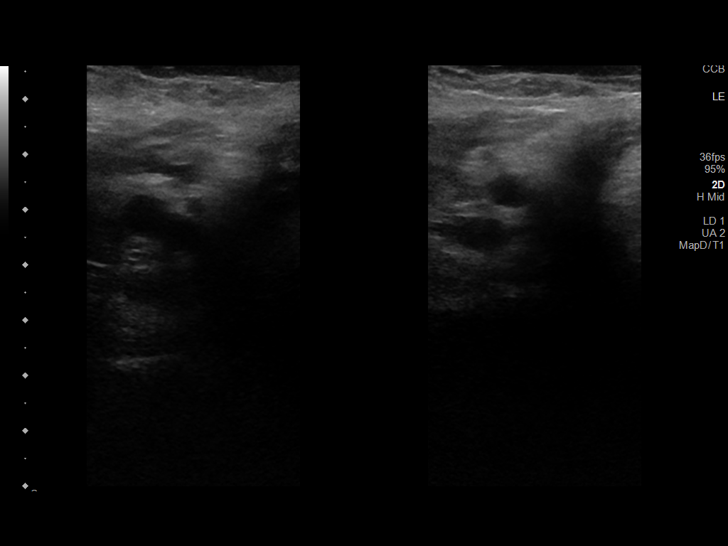
[im 55/61]
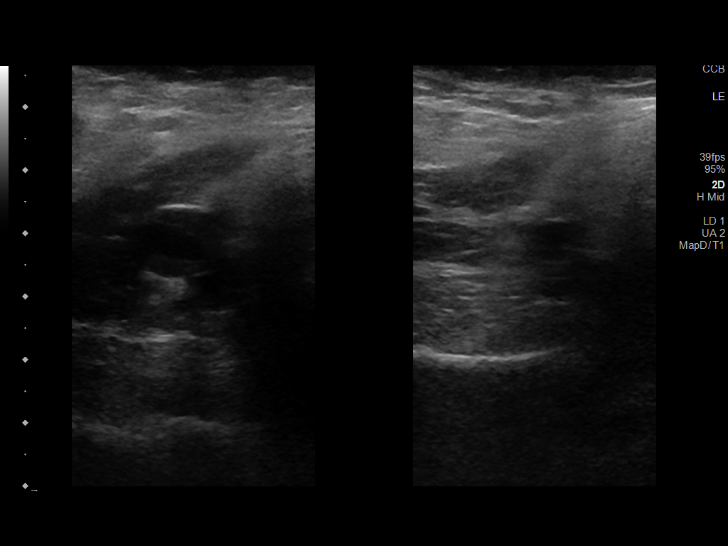
[im 61/61]
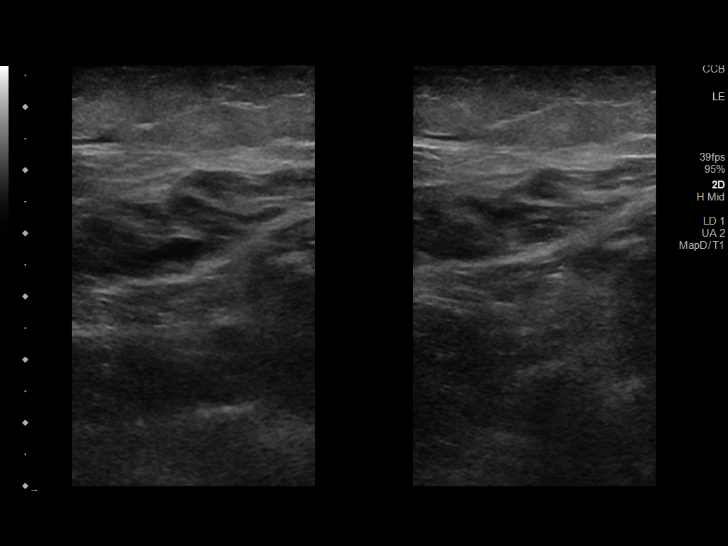

[13 of 24 positions shown; findings below may reference images not displayed]

FINDINGS: RIGHT LOWER EXTREMITY

Common Femoral Vein: No evidence of thrombus. Normal
compressibility, respiratory phasicity and response to augmentation.

Saphenofemoral Junction: No evidence of thrombus. Normal
compressibility and flow on color Doppler imaging.

Profunda Femoral Vein: No evidence of thrombus. Normal
compressibility and flow on color Doppler imaging.

Femoral Vein: No evidence of thrombus. Normal compressibility,
respiratory phasicity and response to augmentation.

Popliteal Vein: No evidence of thrombus. Normal compressibility,
respiratory phasicity and response to augmentation.

Calf Veins: No evidence of thrombus. Normal compressibility and flow
on color Doppler imaging.

Superficial Great Saphenous Vein: No evidence of thrombus. Normal
compressibility.

Venous Reflux:  None.

Other Findings: A 2.9 cm x 2.9 cm hypoechoic area is seen within the
soft tissues of the right popliteal fossa. No abnormal flow is seen
within this region on color Doppler evaluation.

LEFT LOWER EXTREMITY

Common Femoral Vein: No evidence of thrombus. Normal
compressibility, respiratory phasicity and response to augmentation.

Saphenofemoral Junction: No evidence of thrombus. Normal
compressibility and flow on color Doppler imaging.

Profunda Femoral Vein: No evidence of thrombus. Normal
compressibility and flow on color Doppler imaging.

Femoral Vein: No evidence of thrombus. Normal compressibility,
respiratory phasicity and response to augmentation.

Popliteal Vein: No evidence of thrombus. Normal compressibility,
respiratory phasicity and response to augmentation.

Calf Veins: No evidence of thrombus. Normal compressibility and flow
on color Doppler imaging.

Superficial Great Saphenous Vein: No evidence of thrombus. Normal
compressibility.

Venous Reflux:  None.

Other Findings:  None.
IMPRESSION: 1. No evidence of deep venous thrombosis in either lower extremity.
2. Right Baker's cyst.

## 2022-06-23 ENCOUNTER — Emergency Department (HOSPITAL_BASED_OUTPATIENT_CLINIC_OR_DEPARTMENT_OTHER): Payer: 59

## 2022-06-23 ENCOUNTER — Other Ambulatory Visit: Payer: Self-pay

## 2022-06-23 ENCOUNTER — Emergency Department (HOSPITAL_BASED_OUTPATIENT_CLINIC_OR_DEPARTMENT_OTHER)
Admission: EM | Admit: 2022-06-23 | Discharge: 2022-06-23 | Disposition: A | Discharge status: Home / Self Care | Payer: 59 | Attending: Emergency Medicine | Admitting: Emergency Medicine

## 2022-06-23 ENCOUNTER — Encounter (HOSPITAL_BASED_OUTPATIENT_CLINIC_OR_DEPARTMENT_OTHER): Payer: Self-pay

## 2022-06-23 DIAGNOSIS — I10 Essential (primary) hypertension: Secondary | ICD-10-CM | POA: Insufficient documentation

## 2022-06-23 DIAGNOSIS — Z79899 Other long term (current) drug therapy: Secondary | ICD-10-CM | POA: Insufficient documentation

## 2022-06-23 DIAGNOSIS — J45909 Unspecified asthma, uncomplicated: Secondary | ICD-10-CM | POA: Insufficient documentation

## 2022-06-23 DIAGNOSIS — J181 Lobar pneumonia, unspecified organism: Secondary | ICD-10-CM | POA: Insufficient documentation

## 2022-06-23 DIAGNOSIS — M7121 Synovial cyst of popliteal space [Baker], right knee: Secondary | ICD-10-CM | POA: Diagnosis not present

## 2022-06-23 DIAGNOSIS — I2510 Atherosclerotic heart disease of native coronary artery without angina pectoris: Secondary | ICD-10-CM | POA: Diagnosis not present

## 2022-06-23 DIAGNOSIS — R0602 Shortness of breath: Secondary | ICD-10-CM | POA: Diagnosis present

## 2022-06-23 DIAGNOSIS — J189 Pneumonia, unspecified organism: Secondary | ICD-10-CM

## 2022-06-23 LAB — BASIC METABOLIC PANEL
Anion gap: 7 (ref 5–15)
BUN: 18 mg/dL (ref 8–23)
CO2: 24 mmol/L (ref 22–32)
Calcium: 8.4 mg/dL — ABNORMAL LOW (ref 8.9–10.3)
Chloride: 105 mmol/L (ref 98–111)
Creatinine, Ser: 1.11 mg/dL — ABNORMAL HIGH (ref 0.44–1.00)
GFR, Estimated: 45 mL/min — ABNORMAL LOW (ref >60)
Glucose, Bld: 225 mg/dL — ABNORMAL HIGH (ref 70–99)
Potassium: 3.3 mmol/L — ABNORMAL LOW (ref 3.5–5.1)
Sodium: 136 mmol/L (ref 135–145)

## 2022-06-23 LAB — CBC
HCT: 36.9 % (ref 36.0–46.0)
Hemoglobin: 12.2 g/dL (ref 12.0–15.0)
MCH: 30.6 pg (ref 26.0–34.0)
MCHC: 33.1 g/dL (ref 30.0–36.0)
MCV: 92.5 fL (ref 80.0–100.0)
Platelets: 204 10*3/uL (ref 150–400)
RBC: 3.99 MIL/uL (ref 3.87–5.11)
RDW: 13 % (ref 11.5–15.5)
WBC: 11.7 10*3/uL — ABNORMAL HIGH (ref 4.0–10.5)
nRBC: 0 % (ref 0.0–0.2)

## 2022-06-23 LAB — TROPONIN I (HIGH SENSITIVITY): Troponin I (High Sensitivity): 14 ng/L (ref <18)

## 2022-06-23 LAB — HEPATIC FUNCTION PANEL
ALT: 29 U/L (ref 0–44)
AST: 40 U/L (ref 15–41)
Albumin: 3.6 g/dL (ref 3.5–5.0)
Alkaline Phosphatase: 117 U/L (ref 38–126)
Bilirubin, Direct: 0.3 mg/dL — ABNORMAL HIGH (ref 0.0–0.2)
Indirect Bilirubin: 0.9 mg/dL (ref 0.3–0.9)
Total Bilirubin: 1.2 mg/dL (ref 0.3–1.2)
Total Protein: 7.3 g/dL (ref 6.5–8.1)

## 2022-06-23 LAB — BRAIN NATRIURETIC PEPTIDE: B Natriuretic Peptide: 307.4 pg/mL — ABNORMAL HIGH (ref 0.0–100.0)

## 2022-06-23 MED ORDER — AZITHROMYCIN 250 MG PO TABS: 250.0000 mg | ORAL_TABLET | Freq: Every day | ORAL | 0 refills | Status: AC

## 2022-06-23 MED ORDER — AZITHROMYCIN 250 MG PO TABS
500.0000 mg | ORAL_TABLET | Freq: Once | ORAL | Status: AC
  Administered 2022-06-23: 500 mg via ORAL
  Filled 2022-06-23: qty 2

## 2022-06-23 MED ORDER — POTASSIUM CHLORIDE CRYS ER 20 MEQ PO TBCR
40.0000 meq | EXTENDED_RELEASE_TABLET | Freq: Once | ORAL | Status: AC
  Administered 2022-06-23: 40 meq via ORAL
  Filled 2022-06-23: qty 2

## 2022-06-23 MED ORDER — AMOXICILLIN-POT CLAVULANATE 875-125 MG PO TABS: 1.0000 | ORAL_TABLET | Freq: Once | ORAL | Status: DC

## 2022-06-23 MED ORDER — AMOXICILLIN-POT CLAVULANATE 500-125 MG PO TABS: 1.0000 | ORAL_TABLET | Freq: Two times a day (BID) | ORAL | 0 refills | Status: AC

## 2022-06-23 MED ORDER — AMOXICILLIN-POT CLAVULANATE 875-125 MG PO TABS
0.5000 | ORAL_TABLET | Freq: Once | ORAL | Status: AC
  Administered 2022-06-23: 0.5 via ORAL
  Filled 2022-06-23: qty 1

## 2022-06-23 NOTE — ED Provider Notes (Signed)
[place] [facility] Provider Note   CSN: [id number] Arrival date & time: 06/23/22  1150     History  Chief Complaint  Patient presents with   Shortness of Breath    Wendy Lucas is a 89 y.o. female.  HPI 89 year old female with a history of CAD, hypertension, stroke, asthma, presents with shortness of breath and leg swelling.  Had some chest pain or shortness of breath last night that she states was "bad".  Also had a cough.  She had some shortness of breath this morning but when she took a breathing treatment she felt better.  EMTs came to check on her but her lungs were doing better and she wanted to stay home.  However she was then having some trouble walking due to some pain in her right calf.  She feels like her whole leg is swollen on the right which is atypical for her.  That is the side she had her stroke son and has chronic weakness on that side.  From a shortness of breath and chest pain standpoint these are both resolved.  She has had a little bit of a cough but no fever.  No swelling in the left side.  She felt like her hands were swollen, especially on the right side though this is improving.  Home Medications Prior to Admission medications   Medication Sig Start Date End Date Taking? Authorizing Provider  amoxicillin-clavulanate (AUGMENTIN) 500-125 MG tablet Take 1 tablet by mouth in the morning and at bedtime for 5 days. 06/23/22 06/28/22 Yes Pricilla Loveless, MD  azithromycin (ZITHROMAX) 250 MG tablet Take 1 tablet (250 mg total) by mouth daily for 4 days. 06/24/22 06/28/22 Yes Pricilla Loveless, MD  acetaminophen (TYLENOL) 500 MG tablet Take 500 mg by mouth 2 (two) times daily.     [provider]  albuterol (PROVENTIL HFA;VENTOLIN HFA) 108 (90 Base) MCG/ACT inhaler INHALE 2 PUFFS INTO THE LUNGS EVERY 4 HOURS AS NEEDED FOR WHEEZING 08/07/17   [provider]  amLODipine (NORVASC) 10 MG tablet Take 10 mg by mouth daily.       [provider]  atenolol (TENORMIN) 50 MG tablet Take 75 mg by mouth 2 (two) times daily.     [provider]  atorvastatin (LIPITOR) 40 MG tablet TAKE 1 TABLET BY MOUTH  EVERY DAY 08/28/17   [provider]  budesonide (PULMICORT) 0.5 MG/2ML nebulizer solution Take 2 mLs (0.5 mg total) by nebulization 2 times daily. 08/07/17   [provider]  clobetasol (TEMOVATE) 0.05 % ointment Apply topically 2 (two) times daily.      [provider]  clopidogrel (PLAVIX) 75 MG tablet Take 75 mg by mouth daily.      [provider]  furosemide (LASIX) 20 MG tablet TAKE 1 TABLET BY MOUTH  DAILY AS NEEDED FOR  SWELLING 06/17/17   [provider]  Multiple Vitamin (MULTIVITAMIN) tablet Take 1 tablet by mouth daily.      [provider]  pantoprazole (PROTONIX) 40 MG tablet Take 40 mg by mouth daily.      [provider]  pantoprazole (PROTONIX) 40 MG tablet TAKE 1 TABLET BY MOUTH TWICE DAILY 09/06/17   [provider]  Pitavastatin Calcium (LIVALO) 4 MG TABS Take 1 tablet by mouth daily.      [provider]  potassium chloride (KLOR-CON) 10 MEQ CR tablet Take 10 mEq by mouth 2 (two) times daily.  [provider]  potassium chloride SA (K-DUR,KLOR-CON) 20 MEQ tablet Take 1 tablet (20 mEq total) by mouth daily. 12/08/17   Arby Barrette, MD  pravastatin (PRAVACHOL) 80 MG tablet Take 80 mg by mouth at bedtime.    [provider]      Allergies    Iohexol, Shellfish allergy, Aspirin, Codeine, Sulfa antibiotics, and Tramadol    Review of Systems   Review of Systems  Constitutional:  Negative for fever.  Respiratory:  Positive for cough and shortness of breath.   Cardiovascular:  Positive for chest pain and leg swelling.    Physical Exam Updated Vital Signs BP 126/67 (BP Location: Right Arm)   Pulse 70   Temp 98.1 F (36.7 C) (Oral)   Resp 15   Ht 5\' 2"  (1.575 m)   Wt 74.8 kg   SpO2 99%    BMI 30.18 kg/m  Physical Exam Vitals and nursing note reviewed.  Constitutional:      Appearance: She is well-developed.  HENT:     Head: Normocephalic and atraumatic.  Cardiovascular:     Rate and Rhythm: Normal rate and regular rhythm.     Pulses:          Dorsalis pedis pulses are 2+ on the right side and 2+ on the left side.     Heart sounds: Normal heart sounds.  Pulmonary:     Effort: Pulmonary effort is normal.     Breath sounds: Normal breath sounds. No wheezing, rhonchi or rales.  Abdominal:     Palpations: Abdomen is soft.     Tenderness: There is no abdominal tenderness.  Musculoskeletal:     Right hip: Normal range of motion.     Right knee: Normal range of motion.     Comments: There is some diffuse thigh/lower leg tenderness. There is swelling to the right medial calf that is tender. No color change I do not appreciate any significant swelling to the hands besides some mild PIP swelling to her joints on both hands.  Skin:    General: Skin is warm and dry.  Neurological:     Mental Status: She is alert.     ED Results / Procedures / Treatments   Labs (all labs ordered are listed, but only abnormal results are displayed) Labs Reviewed  BASIC METABOLIC PANEL - Abnormal; Notable for the following components:      Result Value   Potassium 3.3 (*)    Glucose, Bld 225 (*)    Creatinine, Ser 1.11 (*)    Calcium 8.4 (*)    GFR, Estimated 45 (*)    All other components within normal limits  CBC - Abnormal; Notable for the following components:   WBC 11.7 (*)    All other components within normal limits  HEPATIC FUNCTION PANEL - Abnormal; Notable for the following components:   Bilirubin, Direct 0.3 (*)    All other components within normal limits  BRAIN NATRIURETIC PEPTIDE - Abnormal; Notable for the following components:   B Natriuretic Peptide 307.4 (*)    All other components within normal limits  TROPONIN I (HIGH SENSITIVITY)    EKG EKG  Interpretation  Date/Time:  Saturday June 23 2022 12:01:48 EDT Ventricular Rate:  81 PR Interval:  215 QRS Duration: 85 QT Interval:  412 QTC Calculation: 479 R Axis:   39 Text Interpretation: Sinus rhythm Borderline prolonged PR interval Consider left atrial enlargement no significant change since July 2022 Confirmed by Pricilla Loveless [phone]) on 06/23/2022  12:13:25 PM  Radiology US Venous Img Lower Unilateral Right  Result Date: 06/23/2022 CLINICAL DATA:  Right leg pain and swelling. EXAM: RIGHT LOWER EXTREMITY VENOUS DOPPLER ULTRASOUND TECHNIQUE: Gray-scale sonography with compression, as well as color and duplex ultrasound, were performed to evaluate the deep venous system(s) from the level of the common femoral vein through the popliteal and proximal calf veins. COMPARISON:  None Available. FINDINGS: VENOUS Normal compressibility of the common femoral, superficial femoral, and popliteal veins, as well as the visualized calf veins. Visualized portions of profunda femoral vein and great saphenous vein unremarkable. No filling defects to suggest DVT on grayscale or color Doppler imaging. Doppler waveforms show normal direction of venous flow, normal respiratory plasticity and response to augmentation. Limited views of the contralateral common femoral vein are unremarkable. OTHER Small heterogeneous popliteal cyst measuring 5.7 x 3.4 x 1.0 cm. Limitations: none IMPRESSION: 1. No evidence of deep venous thrombosis in the right lower extremity. 2. Small complicated popliteal cyst. Electronically Signed   By: Beckie SaltsSteven  Reid M.D.   On: 06/23/2022 13:30   DG Chest 2 View  Result Date: 06/23/2022 CLINICAL DATA:  Chest pain EXAM: CHEST - 2 VIEW COMPARISON:  10/14/2020 FINDINGS: Heart size within normal limits. Aortic atherosclerosis. Subtle opacity in the right mid lung. The left lung appears clear. No pleural effusion or pneumothorax. IMPRESSION: Subtle opacity in the right mid lung, which may represent  atelectasis or developing pneumonia. Electronically Signed   By: Duanne GuessNicholas  Plundo D.O.   On: 06/23/2022 12:34    Procedures Procedures    Medications Ordered in ED Medications  potassium chloride SA (KLOR-CON M) CR tablet 40 mEq (40 mEq Oral Given 06/23/22 1325)  azithromycin (ZITHROMAX) tablet 500 mg (500 mg Oral Given 06/23/22 1418)  amoxicillin-clavulanate (AUGMENTIN) 875-125 MG per tablet 0.5 tablet (0.5 tablets Oral Given 06/23/22 1418)    ED Course/ Medical Decision Making/ A&P                             Medical Decision Making Amount and/or Complexity of Data Reviewed Labs: ordered.    Details: Mild leukocytosis.  Mild hypokalemia.  Stable renal function.  Troponin not consistent with ACS Radiology: ordered and independent interpretation performed.    Details: Possible pneumonia on the right side.  No DVT but does have Baker's cyst. ECG/medicine tests: ordered and independent interpretation performed.    Details: Unchanged from baseline.  Risk Prescription drug management.   Patient's last episode of chest pain was last night and given nonischemic EKG and troponin that is overall unremarkable, I think ACS is unlikely and a second is not needed given length of time of symptoms.  Shortness of breath seems to be gone as well.  She has had a cough and with the possible pneumonia on x-ray we will treat for pneumonia.  Her vitals are normal and she prefers to go home which I think is reasonable, despite her comorbidities.  I think PE is less likely, especially with no DVT in the ultrasound and a better explanation for her shortness of breath.  At this point, she is found to have a Baker's cyst which does seem to be the primary every pain behind her knee.  However she is ranging her knee and hip well and I think is stable for discharge home with outpatient antibiotics.  Given her creatinine clearance is just under 30, will do a lower dose of Augmentin in addition to azithromycin.  Will give  return precautions.        Final Clinical Impression(s) / ED Diagnoses Final diagnoses:  Community acquired pneumonia of right middle lobe of lung  Synovial cyst of right popliteal space    Rx / DC Orders ED Discharge Orders          Ordered    azithromycin (ZITHROMAX) 250 MG tablet  Daily        06/23/22 1415    amoxicillin-clavulanate (AUGMENTIN) 500-125 MG tablet  2 times daily        06/23/22 1415              Pricilla Loveless, MD 06/23/22 1449

## 2022-06-23 NOTE — ED Notes (Signed)
Discharge paperwork reviewed entirely with patient, including Rx's and follow up care. Pain was under control. Pt verbalized understanding as well as all parties involved. No questions or concerns voiced at the time of discharge. No acute distress noted.   Pt was wheeled out to the PVA in a wheelchair without incident.  

## 2022-06-23 NOTE — Discharge Instructions (Addendum)
Your x-ray shows possible pneumonia.  This would explain your cough and other symptoms and so we are giving you 2 antibiotics to help cover the most likely causes.  Otherwise, you were given your first dose of antibiotics today, start the second dose tomorrow morning.  Your ultrasound shows no blood clot but does show what is called a Baker's cyst.  This should go away on its own.  If you develop high fever, coughing up blood, new or worsening shortness of breath, chest pain, or any other new/concerning symptoms then return to the ER or call 911.

## 2022-06-23 NOTE — ED Triage Notes (Signed)
Pt presents with acute onset ShOB that started last night with some associated CP. Pt also reports swelling in the R leg and R arm. Pt did take NTG last PM with relief for CP. Pt had a nebulizer this AM with some relief.
